# Patient Record
Sex: Female | Born: 2000
Health system: Southern US, Community
[De-identification: ages and names within clinical notes are randomized; demographics above are authoritative.]

## PROBLEM LIST (undated history)

## (undated) DIAGNOSIS — J45909 Unspecified asthma, uncomplicated: Secondary | ICD-10-CM

## (undated) DIAGNOSIS — R011 Cardiac murmur, unspecified: Secondary | ICD-10-CM

---
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
## 2018-06-03 ENCOUNTER — Encounter: Payer: Self-pay | Admitting: Neurology

## 2018-06-10 ENCOUNTER — Ambulatory Visit (INDEPENDENT_AMBULATORY_CARE_PROVIDER_SITE_OTHER): Admitting: Neurology

## 2018-06-10 ENCOUNTER — Encounter: Payer: Self-pay | Admitting: Neurology

## 2018-06-10 ENCOUNTER — Other Ambulatory Visit (INDEPENDENT_AMBULATORY_CARE_PROVIDER_SITE_OTHER)

## 2018-06-10 VITALS — BP 86/60 | HR 88 | Ht 65.0 in | Wt 119.0 lb

## 2018-06-10 DIAGNOSIS — F419 Anxiety disorder, unspecified: Secondary | ICD-10-CM | POA: Diagnosis not present

## 2018-06-10 DIAGNOSIS — R413 Other amnesia: Secondary | ICD-10-CM

## 2018-06-10 DIAGNOSIS — F329 Major depressive disorder, single episode, unspecified: Secondary | ICD-10-CM

## 2018-06-10 DIAGNOSIS — F0781 Postconcussional syndrome: Secondary | ICD-10-CM | POA: Diagnosis not present

## 2018-06-10 DIAGNOSIS — G43009 Migraine without aura, not intractable, without status migrainosus: Secondary | ICD-10-CM

## 2018-06-10 DIAGNOSIS — F32A Depression, unspecified: Secondary | ICD-10-CM

## 2018-06-10 LAB — VITAMIN B12: Vitamin B-12: 708 pg/mL (ref 211–911)

## 2018-06-10 MED ORDER — NORTRIPTYLINE HCL 10 MG PO CAPS: 10.0000 mg | ORAL_CAPSULE | Freq: Every day | ORAL | 3 refills | Status: AC

## 2018-06-10 NOTE — Patient Instructions (Signed)
1.  To help reduce headaches and help with sleep, will start nortriptyline 10mg  at bedtime.  If symptoms not improved in 4 weeks, contact me and we can increase dose.  Do not stop Wellbutrin unless you discuss with your PCP. 2.  For abortive therapy, try Excedrin.  If ineffective, contact me 3.  Limit use of pain relievers to no more than 2 days out of week to prevent risk of rebound or medication-overuse headache. 4.  Keep headache diary 5.  Exercise, hydration, caffeine cessation, sleep hygiene, monitor for and avoid triggers 6.  Consider:  magnesium citrate 400mg  daily, riboflavin 400mg  daily, and coenzyme Q10 100mg  three times daily 7.  To assess memory, I will refer you for neurocognitive testing.  We will also check B12 level 8.  Due to persistent concussion symptoms, will check MRI of brain without contrast. 9. Follow up in 4 months

## 2018-06-10 NOTE — Progress Notes (Signed)
NEUROLOGY CONSULTATION NOTE  Emma Huffman MRN: [id number] DOB: 05/06/2000  Referring provider: Wess Botts, PA Primary care provider: Wess Botts, PA  Reason for consult:  headaches  HISTORY OF PRESENT ILLNESS: Emma Huffman is a 18 year old woman with asthma and depression who presents for headaches.  Street supplemented by referring provider note.  She is accompanied by her mother who supplements history.  She sustained a concussion in December 2018 after a metal statue was accidentally pushed off a counter, and hit her in the right forehead.  There was associated loss of consciousness.  On 05/18/17, she was seen in the ED at [facility] where CT of the head that was performed and was negative.  She was diagnosed with a concussion at the time and underwent rehabilitation.  Symptoms gradually improved and resolved.  In December 2019, she began having recurrence of her concussion symptoms, although she did not have any recurrent concussion.  Headaches are described as severe pounding and pressure-like pain in the right forehead that spreads over her entire head.  She reports intermittent blurred vision, difficulty focusing, photophobia, phonophobia, nausea and lightheadedness.  She also reports problems with memory and feeling unbalanced on her feet.  There is no associated unilateral numbness or weakness.  There is no preceding aura.  Headaches typically last all day, and occur 3 to 4 times a week.  They are triggered by bright lights/headlights.  They are not relieved by anything.  She was also evaluated by gastroenterology for intractable nausea and vomiting.    Current NSAIDS:  none Current analgesics:  none Current triptans:  none Current ergotamine:  none Current anti-emetic:  Zofran 8mg  Current muscle relaxants:  none Current anti-anxiolytic:  none Current sleep aide:  none Current Antihypertensive medications:  none Current Antidepressant medications:  Wellbutrin 150mg  Current  Anticonvulsant medications:  none Current anti-CGRP:  none Current Vitamins/Herbal/Supplements:  none Current Antihistamines/Decongestants:  Singulair Other therapy:  none Hormone/birth control:  none  Past NSAIDS:  none Past analgesics:  Tylenol Past abortive triptans:  none Past abortive ergotamine:  none Past muscle relaxants:  none Past anti-emetic:  none Past antihypertensive medications:  none Past antidepressant medications:  none Past anticonvulsant medications:  none Past anti-CGRP:  none Past vitamins/Herbal/Supplements:  none Past antihistamines/decongestants:  none Other past therapies:  none  Caffeine:  No coffee.  Rarely soda. Diet:  Tries to drink water Exercise:  no Depression:  yes; Anxiety:  yes Other pain:  no Sleep hygiene:  poor Family history of headache:  No  PAST MEDICAL HISTORY: Depression and anxiety  PAST SURGICAL HISTORY: None  MEDICATIONS: Current Outpatient Medications on File Prior to Visit  Medication Sig Dispense Refill  . esomeprazole (NEXIUM) 20 MG capsule Take 20 mg by mouth daily.    Marland Kitchen linaclotide (LINZESS) 72 MCG capsule Take 72 mcg by mouth daily before breakfast.    . ondansetron (ZOFRAN) 4 MG tablet Take 8 mg by mouth 3 (three) times daily as needed.    . Albuterol Sulfate, sensor, (PROAIR DIGIHALER) 108 (90 Base) MCG/ACT AEPB Inhale into the lungs.    Marland Kitchen buPROPion (WELLBUTRIN XL) 150 MG 24 hr tablet Take 150 mg by mouth daily.    . montelukast (SINGULAIR) 10 MG tablet Take 10 mg by mouth daily.     No current facility-administered medications on file prior to visit.     ALLERGIES: No known allergies  FAMILY HISTORY: Grandparents:  Diabetes Sister(s):  Lung disease, asthma, seizures  SOCIAL HISTORY: Marital status:  Single  Tobacco use:  None Alcohol use:  None  REVIEW OF SYSTEMS: Constitutional: No fevers, chills, or sweats, no generalized fatigue, change in appetite Eyes: No visual changes, double vision, eye  pain Ear, nose and throat: No hearing loss, ear pain, nasal congestion, sore throat Cardiovascular: No chest pain, palpitations Respiratory:  No shortness of breath at rest or with exertion, wheezes GastrointestinaI: No nausea, vomiting, diarrhea, abdominal pain, fecal incontinence Genitourinary:  No dysuria, urinary retention or frequency Musculoskeletal:  No neck pain, back pain Integumentary: No rash, pruritus, skin lesions Neurological: as above Psychiatric: depression, insomnia, anxiety Endocrine: No palpitations, fatigue, diaphoresis, mood swings, change in appetite, change in weight, increased thirst Hematologic/Lymphatic:  No purpura, petechiae. Allergic/Immunologic: no itchy/runny eyes, nasal congestion, recent allergic reactions, rashes  PHYSICAL EXAM: Blood pressure (!) 86/60, pulse 88, height 5\' 5"  (1.651 m), weight 119 lb (54 kg), SpO2 99 %. General: No acute distress.  Patient appears well-groomed.  Head:  Normocephalic/atraumatic Eyes:  fundi examined but not visualized Neck: supple, no paraspinal tenderness, full range of motion Back: No paraspinal tenderness Heart: regular rate and rhythm Lungs: Clear to auscultation bilaterally. Vascular: No carotid bruits. Neurological Exam: Mental status: alert and oriented to person, place, and time, recent and remote memory intact, fund of knowledge intact, attention and concentration intact, speech fluent and not dysarthric, language intact. Cranial nerves: CN I: not tested CN II: pupils equal, round and reactive to light, visual fields intact CN III, IV, VI:  full range of motion, no nystagmus, no ptosis CN V: facial sensation intact CN VII: upper and lower face symmetric CN VIII: hearing intact CN IX, X: gag intact, uvula midline CN XI: sternocleidomastoid and trapezius muscles intact CN XII: tongue midline Bulk & Tone: normal, no fasciculations. Motor:  5/5 throughout  Sensation:  temperature and vibration sensation  intact. Deep Tendon Reflexes:  2+ throughout, toes downgoing.  Finger to nose testing:  Without dysmetria.  Heel to shin:  Without dysmetria.  Gait:  Normal station and stride.  Romberg negative.  IMPRESSION: Persistent postconcussive syndrome:  Migraine without aura, without status migrainosus, not intractable  Memory deficits  Insomnia Depression and anxiety  PLAN: 1.  Start nortriptyline 10mg  at bedtime.  We can increase to 25mg  at bedtime in 4 weeks if needed. 2.  For abortive therapy, Excedrin.  If ineffective, will try sumatriptan 3.  Limit use of pain relievers to no more than 2 days out of week to prevent risk of rebound or medication-overuse headache. 4.  Keep headache diary 5. Due to persistent concussive symptoms, getting worse, will check MRI of brain without contrast 6. To evaluate memory, check B12 and refer for neuropsychological testing. 7.  Exercise, hydration, caffeine cessation, sleep hygiene, monitor for and avoid triggers 8.  Consider:  magnesium citrate 400mg  daily, riboflavin 400mg  daily, and coenzyme Q10 100mg  three times daily 9.  Follow up in 4 months.   Thank you for allowing me to take part in the care of this patient.  Shon Millet, DO

## 2018-06-10 NOTE — Addendum Note (Signed)
Addended by: Dorthy Cooler on: 06/10/2018 01:26 PM   Modules accepted: Orders

## 2018-06-12 ENCOUNTER — Telehealth: Payer: Self-pay

## 2018-06-12 NOTE — Telephone Encounter (Signed)
-----   Message from Drema Dallas, DO sent at 06/10/2018  7:26 PM EST ----- B12 level is normal

## 2018-06-12 NOTE — Telephone Encounter (Signed)
Called and advised Pt of results. 

## 2018-06-16 ENCOUNTER — Ambulatory Visit
Admission: RE | Admit: 2018-06-16 | Discharge: 2018-06-16 | Disposition: A | Discharge status: Home / Self Care | Source: Ambulatory Visit | Attending: Neurology | Admitting: Neurology

## 2018-06-16 ENCOUNTER — Encounter: Payer: Self-pay | Admitting: Radiology

## 2018-06-16 DIAGNOSIS — R413 Other amnesia: Secondary | ICD-10-CM

## 2018-06-16 DIAGNOSIS — F0781 Postconcussional syndrome: Secondary | ICD-10-CM

## 2018-06-16 DIAGNOSIS — G43009 Migraine without aura, not intractable, without status migrainosus: Secondary | ICD-10-CM

## 2018-06-16 IMAGING — MR MR HEAD W/O CM
10 series · 48 of 48 positions shown · non-contrast
Comparison: None.

CLINICAL DATA: Migraine headaches. History of concussion. Memory
loss.

EXAM:
MRI HEAD WITHOUT CONTRAST
TECHNIQUE: Multiplanar, multiecho pulse sequences of the brain and surrounding
structures were obtained without intravenous contrast.

[Series 2: T1 · sagittal · 5.0mm · 0.45mm/px · 1 of 21 slices shown]
[im 1/21]
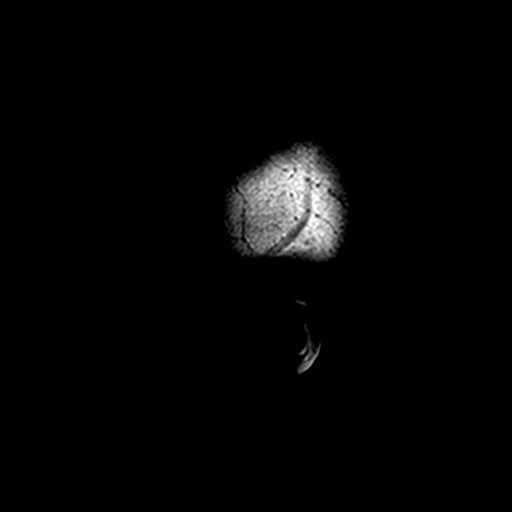

[Series 3: DWI · axial · 3.0mm · 1.80mm/px · z∈[-48,+89]mm · 9 of 100 slices shown (1 of 4)]
[im 1/100]
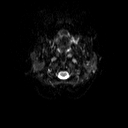
[im 13/100]
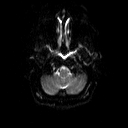
[im 25/100]
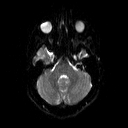
[im 38/100]
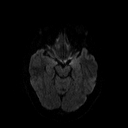
[im 50/100]
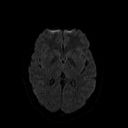
[im 62/100]
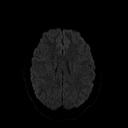
[im 75/100]
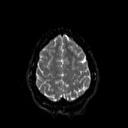
[im 87/100]
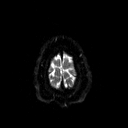
[im 100/100]
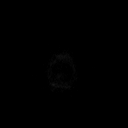

[Series 4: DWI · axial · 3.0mm · 1.80mm/px · z∈[-48,+89]mm · 4 of 49 slices shown (2 of 4)]
[im 1/49]
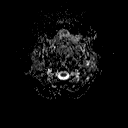
[im 17/49]
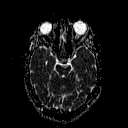
[im 33/49]
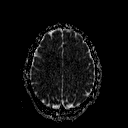
[im 49/49]
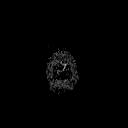

[Series 5: DWI · coronal · 5.0mm · 1.80mm/px · 6 of 68 slices shown (3 of 4)]
[im 1/68]
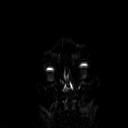
[im 14/68]
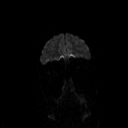
[im 27/68]
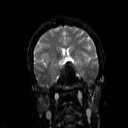
[im 41/68]
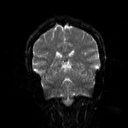
[im 54/68]
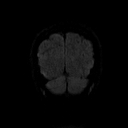
[im 68/68]
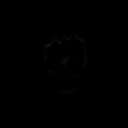

[Series 6: DWI · coronal · 5.0mm · 1.80mm/px · 3 of 34 slices shown (4 of 4)]
[im 1/34]
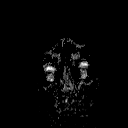
[im 17/34]
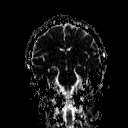
[im 34/34]
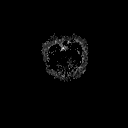

[Series 7: T2 · axial · 5.0mm · 0.51mm/px · z∈[-49,+88]mm · 2 of 22 slices shown (1 of 2)]
[im 1/22]
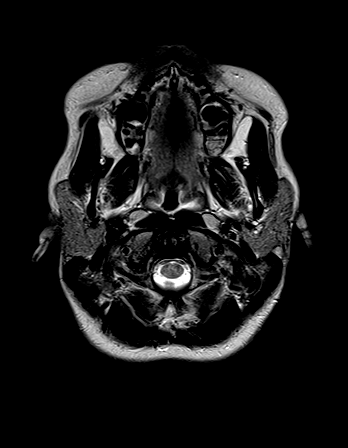
[im 22/22]
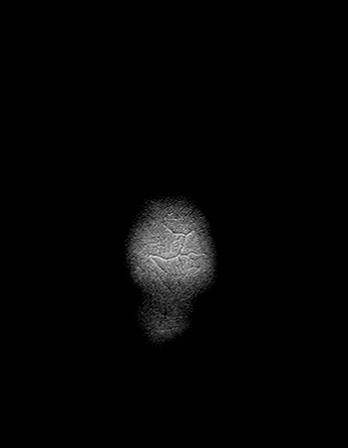

[Series 8: FLAIR · axial · 3.0mm · 0.45mm/px · z∈[-53,+89]mm · 3 of 34 slices shown]
[im 1/34]
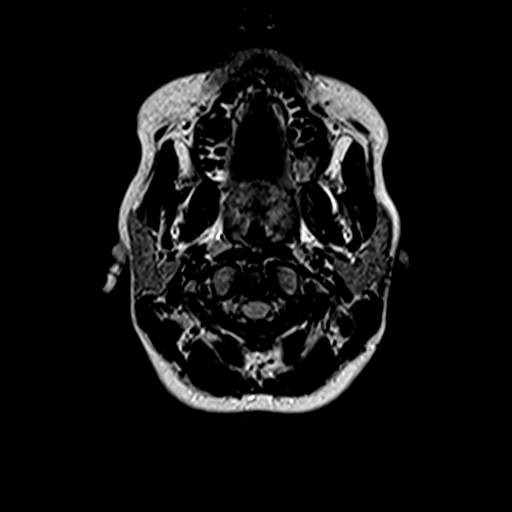
[im 17/34]
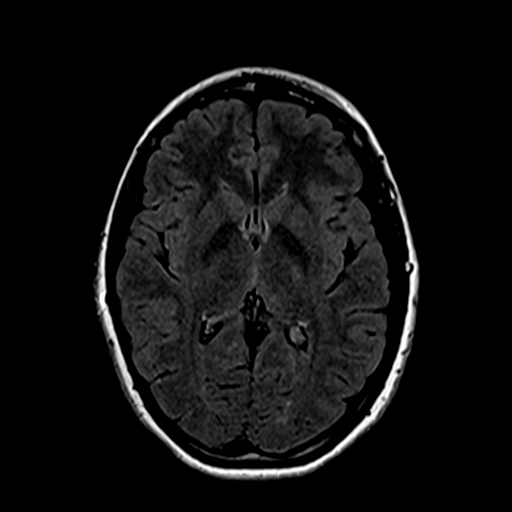
[im 34/34]
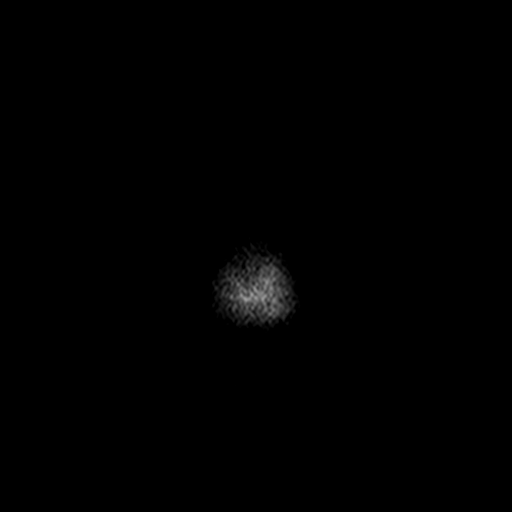

[Series 10: swi_images · axial · 4.0mm · 0.90mm/px · z∈[-54,+91]mm · 4 of 40 slices shown]
[im 1/40]
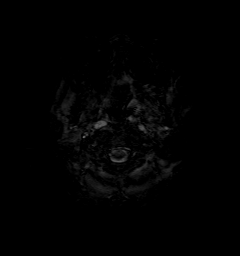
[im 14/40]
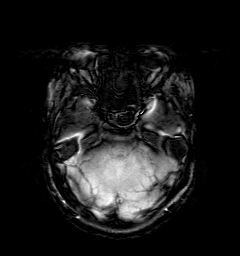
[im 27/40]
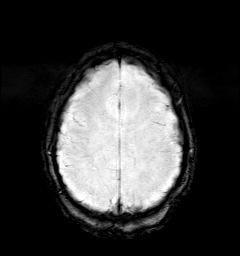
[im 40/40]
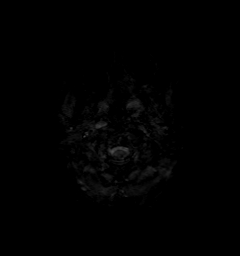

[Series 11: t1_mpr_tra · axial · 1.1mm · 0.71mm/px · z∈[-52,+92]mm · 13 of 144 slices shown]
[im 1/144]
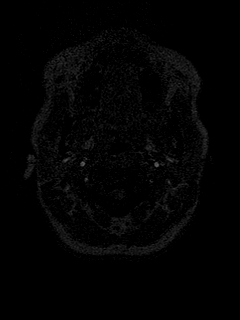
[im 12/144]
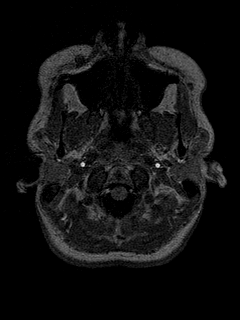
[im 24/144]
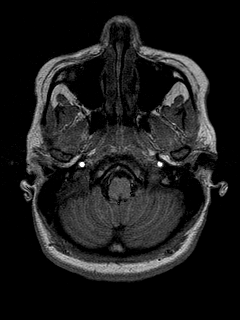
[im 36/144]
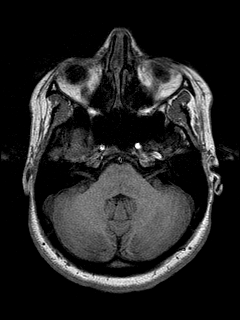
[im 48/144]
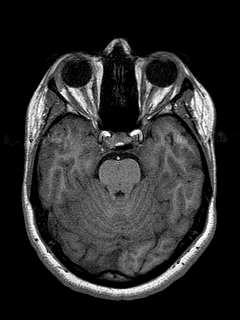
[im 60/144]
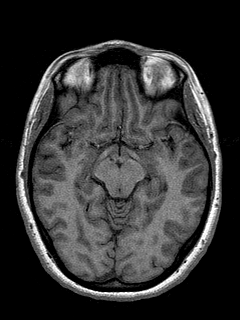
[im 72/144]
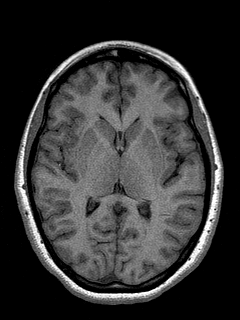
[im 84/144]
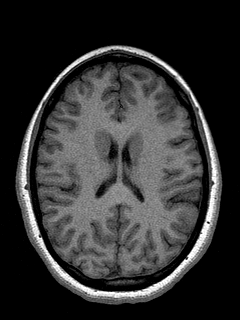
[im 96/144]
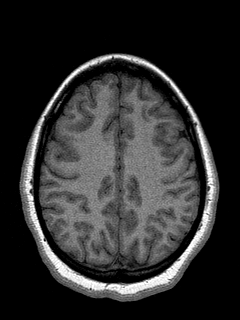
[im 108/144]
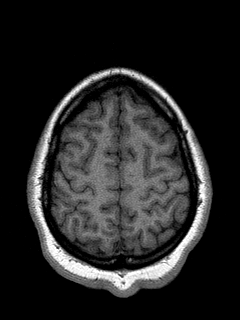
[im 120/144]
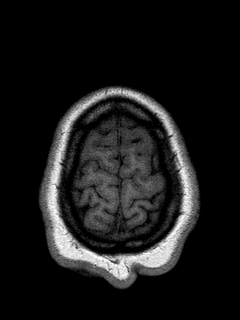
[im 132/144]
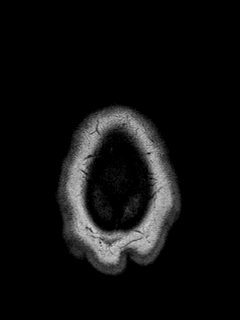
[im 144/144]
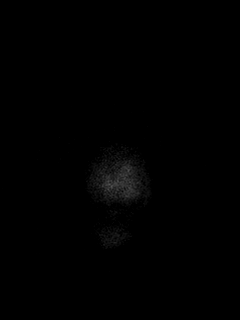

[Series 12: T2 · coronal · 5.0mm · 0.45mm/px · 3 of 29 slices shown (2 of 2)]
[im 1/29]
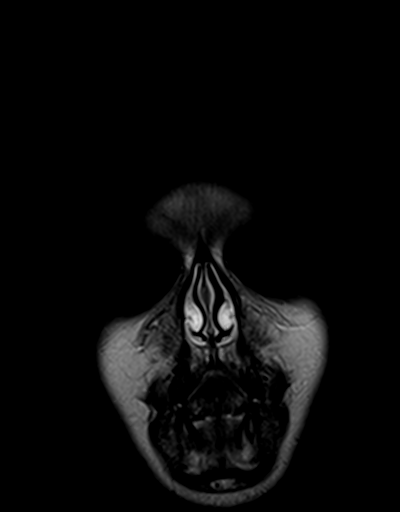
[im 15/29]
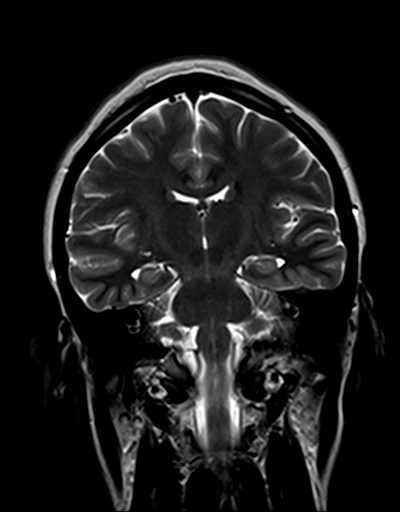
[im 29/29]
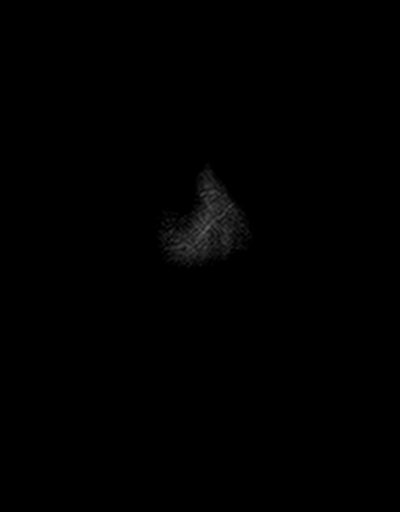

[48 of 48 positions shown; findings below may reference images not displayed]

FINDINGS: Brain: The brain has a normal appearance without evidence of
malformation, atrophy, old or acute small or large vessel
infarction, mass lesion, hemorrhage, hydrocephalus or extra-axial
collection.

Vascular: Major vessels at the base of the brain show flow. Venous
sinuses appear patent.

Skull and upper cervical spine: Normal.

Sinuses/Orbits: Clear/normal.

Other: None significant.
IMPRESSION: Normal examination. No post traumatic finding. No cause of the
presenting symptoms is identified.

## 2018-06-19 ENCOUNTER — Telehealth: Payer: Self-pay

## 2018-06-19 NOTE — Telephone Encounter (Signed)
Called and spoke with Pt, advised her MRI was normal

## 2018-06-19 NOTE — Telephone Encounter (Signed)
-----   Message from Drema Dallas, DO sent at 06/18/2018  7:15 AM EST ----- MRI of brain is normal

## 2018-07-09 ENCOUNTER — Telehealth: Payer: Self-pay

## 2018-07-09 NOTE — Telephone Encounter (Signed)
Received a message while the system was down from [facility], [place], asking for notes on Pt and referral. Pt was there.  Attempted to fax notes to the number they provided [place] that we have had difficulty with for months. We have many times ended up mailing the information after many failed attempts faxing. I called the number left on the message, it was a fax number. I faxed the information to [phone]

## 2018-07-23 ENCOUNTER — Telehealth: Payer: Self-pay | Admitting: Neurology

## 2018-07-23 NOTE — Telephone Encounter (Signed)
Called and spoke with Emma Huffman. Apparently the referral was received at a different location, at [facility]. I advised her I will mail the referral.

## 2018-07-23 NOTE — Telephone Encounter (Signed)
Mother is calling in about a referral. She was upset because she got a referral to just another neuro instead of a neuropsych. She is saying her daughter is getting worse and she hasn't heard anything about the new referral for neuropsych. She wants you to call her because she is confused about the next steps. Please call her back at [phone]. Thanks!

## 2018-10-09 ENCOUNTER — Encounter: Payer: Self-pay | Admitting: Neurology

## 2018-10-11 NOTE — Progress Notes (Signed)
No show

## 2018-10-12 ENCOUNTER — Telehealth: Admitting: Neurology

## 2018-10-12 ENCOUNTER — Other Ambulatory Visit: Payer: Self-pay

## 2018-11-20 ENCOUNTER — Encounter (HOSPITAL_COMMUNITY): Payer: Self-pay

## 2018-11-20 ENCOUNTER — Other Ambulatory Visit: Payer: Self-pay

## 2018-11-20 ENCOUNTER — Emergency Department (HOSPITAL_COMMUNITY): Payer: 59

## 2018-11-20 ENCOUNTER — Emergency Department (HOSPITAL_COMMUNITY)
Admission: EM | Admit: 2018-11-20 | Discharge: 2018-11-20 | Disposition: A | Discharge status: Home / Self Care | Payer: 59 | Attending: Emergency Medicine | Admitting: Emergency Medicine

## 2018-11-20 DIAGNOSIS — S61212A Laceration without foreign body of right middle finger without damage to nail, initial encounter: Secondary | ICD-10-CM | POA: Insufficient documentation

## 2018-11-20 DIAGNOSIS — W231XXA Caught, crushed, jammed, or pinched between stationary objects, initial encounter: Secondary | ICD-10-CM | POA: Insufficient documentation

## 2018-11-20 DIAGNOSIS — Z79899 Other long term (current) drug therapy: Secondary | ICD-10-CM | POA: Diagnosis not present

## 2018-11-20 DIAGNOSIS — S6991XA Unspecified injury of right wrist, hand and finger(s), initial encounter: Secondary | ICD-10-CM | POA: Diagnosis present

## 2018-11-20 DIAGNOSIS — J45909 Unspecified asthma, uncomplicated: Secondary | ICD-10-CM | POA: Diagnosis not present

## 2018-11-20 DIAGNOSIS — Y990 Civilian activity done for income or pay: Secondary | ICD-10-CM | POA: Diagnosis not present

## 2018-11-20 DIAGNOSIS — Y9389 Activity, other specified: Secondary | ICD-10-CM | POA: Diagnosis not present

## 2018-11-20 DIAGNOSIS — S61214A Laceration without foreign body of right ring finger without damage to nail, initial encounter: Secondary | ICD-10-CM | POA: Insufficient documentation

## 2018-11-20 DIAGNOSIS — Y92812 Truck as the place of occurrence of the external cause: Secondary | ICD-10-CM | POA: Insufficient documentation

## 2018-11-20 HISTORY — DX: Unspecified asthma, uncomplicated: J45.909

## 2018-11-20 IMAGING — DX RIGHT HAND - COMPLETE 3+ VIEW
3 series · 3 of 3 positions shown · non-contrast
Comparison: None.

CLINICAL DATA: Heavy roller dropped on hand with pain and swelling,
initial encounter

EXAM:
RIGHT HAND - COMPLETE 3+ VIEW

[hand pa]
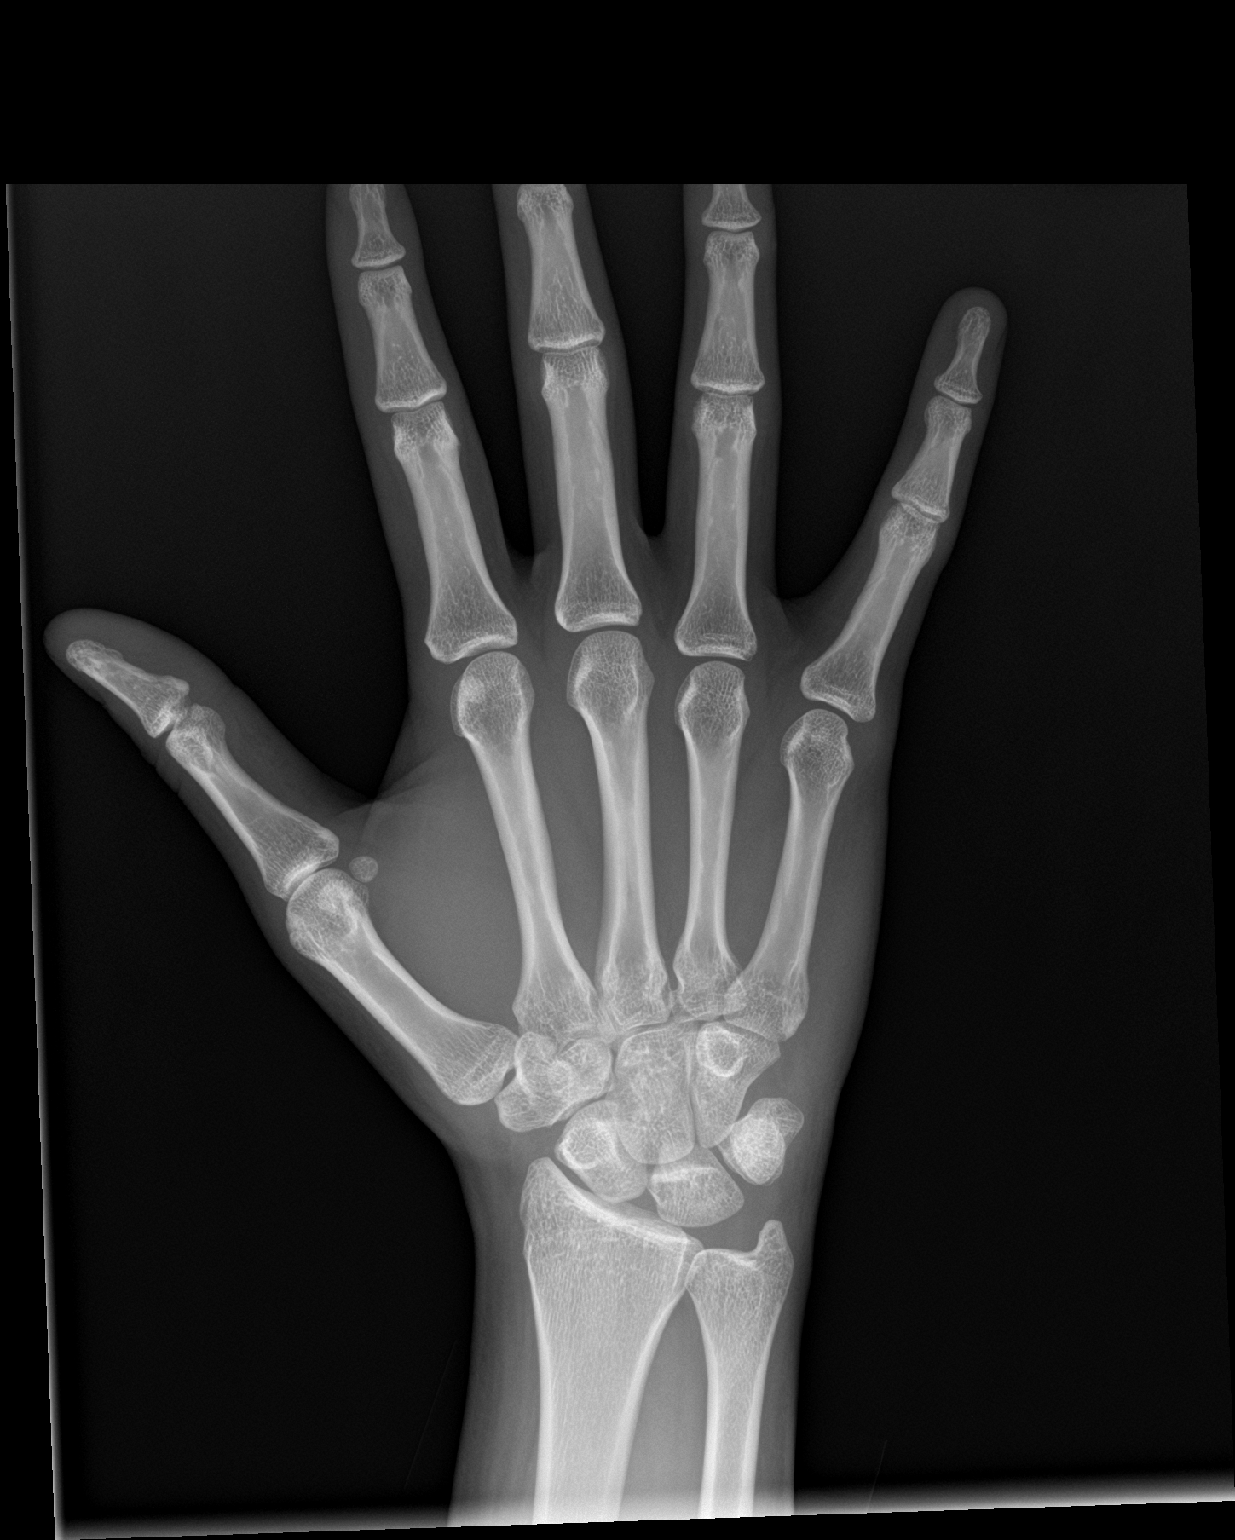

[hand obl]
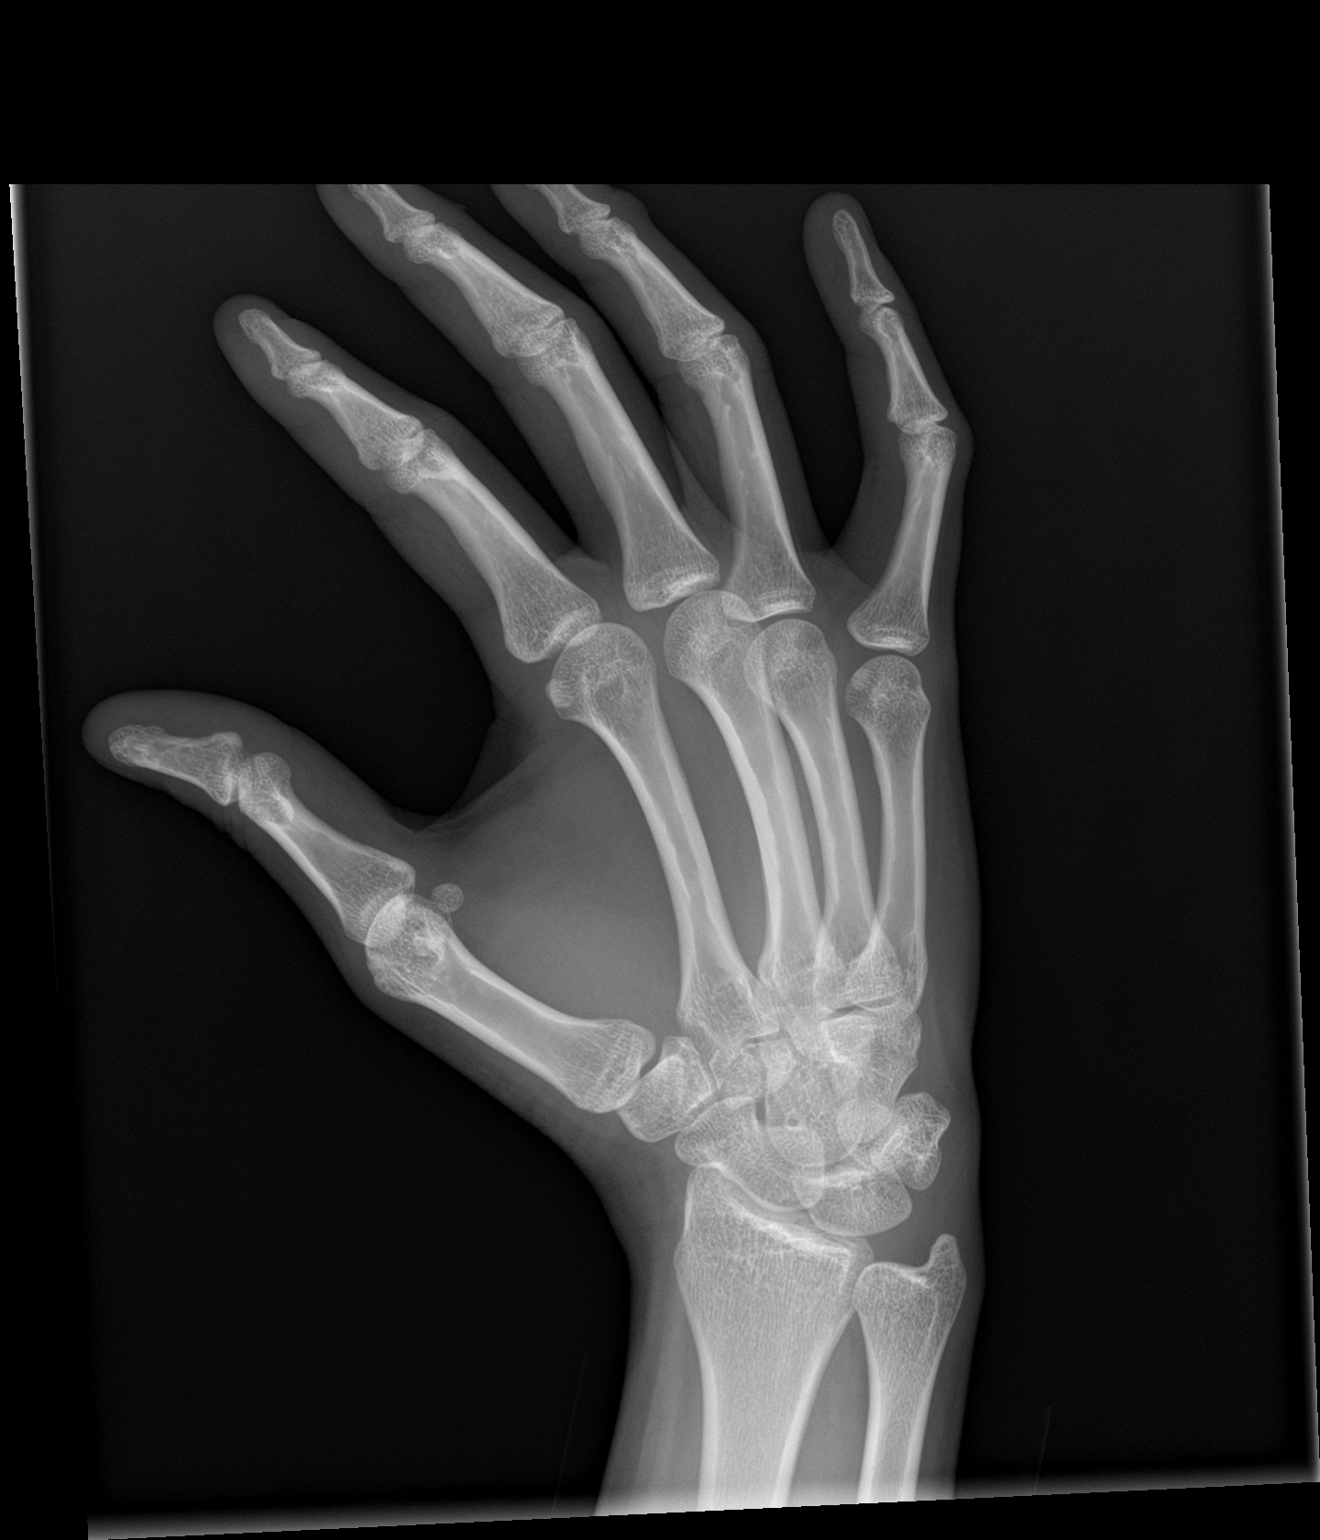

[hand lat]
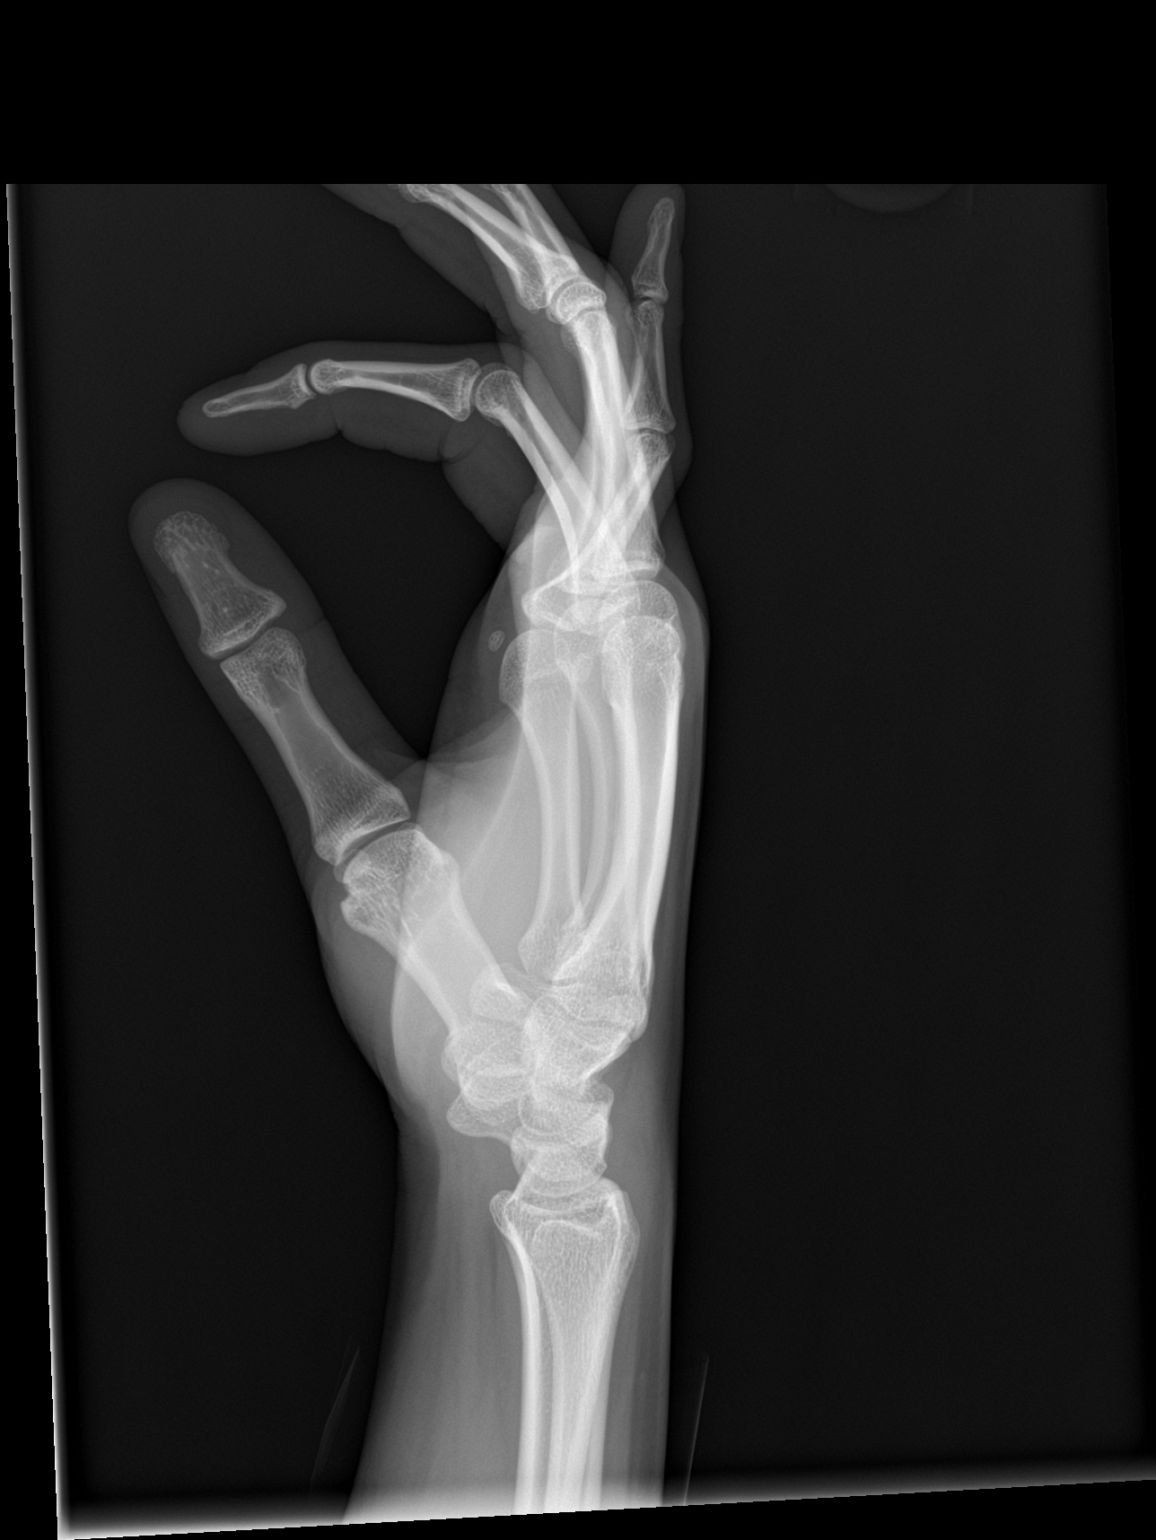

[3 of 3 positions shown; findings below may reference images not displayed]

FINDINGS: Mild soft tissue irregularity is noted about the DIP joints of the
third and fourth digits. No acute fracture or dislocation is noted.
IMPRESSION: No bony abnormality noted.  Mild soft tissue injury is seen.

## 2018-11-20 NOTE — ED Provider Notes (Signed)
[facility]   CSN: [id number] Arrival date & time: 11/20/18  1505    History   Chief Complaint Chief Complaint  Patient presents with  . Finger Injury    HPI Emma Huffman is a 18 y.o. female history of asthma, presenting to the emergency department with laceration to her right third and fourth digits that occurred at work this morning at 3 AM.  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  She states her right hand got smashed under a box.  Her fingers were not pinned for any period of time.  She has had some intermittent bleeding from the wounds to her fingers since then and some pain with movement of the DIP joints of those digits.  No history of immunocompromise.  Tetanus is up-to-date.     The history is provided by the patient.    Past Medical History:  Diagnosis Date  . Asthma     There are no active problems to display for this patient.   Past Surgical History:  Procedure Laterality Date  . KNEE ARTHROSCOPY Left 2018     OB History    Gravida  1   Para      Term      Preterm      AB      Living        SAB      TAB      Ectopic      Multiple      Live Births               Home Medications    Prior to Admission medications   Medication Sig Start Date End Date Taking? Authorizing Provider  Albuterol Sulfate, sensor, (PROAIR DIGIHALER) 108 (90 Base) MCG/ACT AEPB Inhale into the lungs.    [provider]  buPROPion (WELLBUTRIN XL) 150 MG 24 hr tablet Take 150 mg by mouth daily.    [provider]  EPINEPHrine 0.3 mg/0.3 mL IJ SOAJ injection  07/17/18   [provider]  esomeprazole (NEXIUM) 20 MG capsule Take 20 mg by mouth daily. 06/04/18   [provider]  linaclotide (LINZESS) 72 MCG capsule Take 72 mcg by mouth daily before breakfast.    [provider]  montelukast (SINGULAIR) 10 MG tablet Take 10 mg by mouth daily.    [provider]   nortriptyline (PAMELOR) 10 MG capsule Take 1 capsule (10 mg total) by mouth at bedtime. 06/10/18   Everlena CooperJaffe, Adam R, DO  ondansetron (ZOFRAN) 4 MG tablet Take 8 mg by mouth 3 (three) times daily as needed. 03/30/18   [provider]    Family History Family History  Problem Relation Age of Onset  . Depression Mother   . Anxiety disorder Mother   . Post-traumatic stress disorder Father   . Liver disease Father   . Multiple sclerosis Sister   . Seizures Sister   . Depression Sister   . Depression Maternal Grandmother        accidental overdose  . Post-traumatic stress disorder Maternal Grandfather   . Hypertension Maternal Grandfather   . Heart disease Maternal Grandfather     Social History Social History   Tobacco Use  . Smoking status: Never Smoker  . Smokeless tobacco: Never Used  Substance Use Topics  . Alcohol use: Never    Frequency: Never  . Drug use: Never     Allergies   Bee venom   Review  of Systems Review of Systems  Musculoskeletal: Positive for arthralgias.  Skin: Positive for wound.  Allergic/Immunologic: Negative for immunocompromised state.     Physical Exam Updated Vital Signs BP 113/71 (BP Location: Left Arm)   Pulse 77   Temp 99.1 F (37.3 C) (Oral)   LMP 11/20/2018 (Exact Date)   SpO2 100%   Physical Exam Vitals signs and nursing note reviewed.  Constitutional:      General: She is not in acute distress.    Appearance: She is well-developed.  HENT:     Head: Normocephalic and atraumatic.  Eyes:     Conjunctiva/sclera: Conjunctivae normal.  Cardiovascular:     Rate and Rhythm: Normal rate.  Pulmonary:     Effort: Pulmonary effort is normal.  Musculoskeletal:     Comments: There are partial avulsion lacerations over the DIP joints on the dorsum of the right third and fourth digits.  Wound seems superficial and are not actively bleeding.  Not grossly contaminated.  No ungual involvement.  Patient has normal and active range of  motion of the joints of the digits without difficulty.  There is no swelling or deformity.  Normal sensation.  Neurological:     Mental Status: She is alert.  Psychiatric:        Mood and Affect: Mood normal.        Behavior: Behavior normal.      ED Treatments / Results  Labs (all labs ordered are listed, but only abnormal results are displayed) Labs Reviewed - No data to display  EKG None  Radiology Dg Hand Complete Right  Result Date: 11/20/2018 CLINICAL DATA:  Heavy roller dropped on hand with pain and swelling, initial encounter EXAM: RIGHT HAND - COMPLETE 3+ VIEW COMPARISON:  None. FINDINGS: Mild soft tissue irregularity is noted about the DIP joints of the third and fourth digits. No acute fracture or dislocation is noted. IMPRESSION: No bony abnormality noted.  Mild soft tissue injury is seen. Electronically Signed   By: Inez Catalina M.D.   On: 11/20/2018 16:56    Procedures Procedures (including critical care time)  Medications Ordered in ED Medications - No data to display   Initial Impression / Assessment and Plan / ED Course  I have reviewed the triage vital signs and the nursing notes.  Pertinent labs & imaging results that were available during my care of the patient were reviewed by me and considered in my medical decision making (see chart for details).        Patient with superficial lacerations to the right third and fourth digits that occurred at 0300 this morning at work.  Tetanus is up-to-date.  No history of immunocompromise.  X-ray is negative for acute fracture.  Wounds cleaned and dressed, topical bacitracin applied.  Discussed wound care and return precautions.  Patient is safe for discharge.  Discussed results, findings, treatment and follow up. Patient advised of return precautions. Patient verbalized understanding and agreed with plan.   Final Clinical Impressions(s) / ED Diagnoses   Final diagnoses:  Laceration of right ring finger without  foreign body without damage to nail, initial encounter  Laceration of right middle finger without foreign body without damage to nail, initial encounter    ED Discharge Orders    None       Kasi Lasky, [place] N, PA-C 11/20/18 1726    Hayden Rasmussen, MD 11/21/18 [phone]

## 2018-11-20 NOTE — ED Triage Notes (Signed)
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 Happened at 0300 this morning. Pt reports it will not stop bleeding.

## 2018-11-20 NOTE — ED Notes (Signed)
Patient transported to X-ray 

## 2018-11-20 NOTE — Discharge Instructions (Signed)
Please read instructions below.  Keep your wound clean and covered. Gently clean it with soap and water, pat it dry, and reapply a clean bandage. You can take ibuprofen/advil as needed for pain Follow up with your primary care or urgent care for wound recheck as needed. Return to the ER for fever, pus draining from wound, redness, or new or worsening symptoms.

## 2018-12-18 ENCOUNTER — Emergency Department (HOSPITAL_COMMUNITY)
Admission: EM | Admit: 2018-12-18 | Discharge: 2018-12-18 | Disposition: A | Discharge status: Home / Self Care | Payer: 59 | Attending: Emergency Medicine | Admitting: Emergency Medicine

## 2018-12-18 ENCOUNTER — Other Ambulatory Visit: Payer: Self-pay

## 2018-12-18 ENCOUNTER — Encounter (HOSPITAL_COMMUNITY): Payer: Self-pay | Admitting: Emergency Medicine

## 2018-12-18 DIAGNOSIS — R0981 Nasal congestion: Secondary | ICD-10-CM | POA: Diagnosis present

## 2018-12-18 DIAGNOSIS — Z79899 Other long term (current) drug therapy: Secondary | ICD-10-CM | POA: Insufficient documentation

## 2018-12-18 DIAGNOSIS — J45909 Unspecified asthma, uncomplicated: Secondary | ICD-10-CM | POA: Diagnosis not present

## 2018-12-18 DIAGNOSIS — Z20828 Contact with and (suspected) exposure to other viral communicable diseases: Secondary | ICD-10-CM | POA: Insufficient documentation

## 2018-12-18 DIAGNOSIS — Z20822 Contact with and (suspected) exposure to covid-19: Secondary | ICD-10-CM

## 2018-12-18 LAB — SARS CORONAVIRUS 2 (TAT 6-24 HRS): SARS Coronavirus 2: NEGATIVE

## 2018-12-18 NOTE — Discharge Instructions (Addendum)
You have been seen today for runny nose. Please read and follow all provided instructions. Return to the emergency room for worsening condition or new concerning symptoms.    You were tested for covid today. Your results should be available in 2-5 days. Please self isolate and quarantine until you have the results. If positive you will need to continue to self quarantine for 2 weeks or until you are fever free for 3 days without medications.  1. Medications:  Continue usual home medications -You can take Tylenol as needed for fever or pain. Take as directed. You can also take over the counter medications for your symptoms such as nasal spray and decongestants if needed 2. Treatment: rest, drink plenty of fluids 3. Follow Up: Please follow up with your primary doctor in 2-5 days for discussion of your diagnoses and further evaluation after today's visit; Call today to arrange your follow up.  If you do not have a primary care doctor use the resource guide provided to find one;   ?

## 2018-12-18 NOTE — ED Provider Notes (Signed)
[facility] Note   CSN: [id number] Arrival date & time: 12/18/18  1012     History   Chief Complaint Chief Complaint  Patient presents with  . Nasal Congestion    HPI Emma KinsmanJada Comas is a 18 y.o. female with past medical history of asthma presents emergency department today with chief complaint of nasal congestion x2 days.  She states it feels like she has a runny nose, she reports clear mucus.  Patient states she works at [employer] and her job is requesting her to be COVID tested.  Patient states her significant other has had COVID-like symptoms including fever, cough, shortness of breath, loss of taste.  Patient did not take any medications for her symptoms prior to arrival.  She denies any pain.  No radiating or modifying factors.  She denies any other symptoms including fever, chills, chest pain, shortness of breath, sore throat, sinus pain, abdominal pain, nausea, vomiting, diarrhea. History provided by patient with additional history obtained from chart review.     Past Medical History:  Diagnosis Date  . Asthma     There are no active problems to display for this patient.   Past Surgical History:  Procedure Laterality Date  . KNEE ARTHROSCOPY Left 2018     OB History    Gravida  1   Para      Term      Preterm      AB      Living        SAB      TAB      Ectopic      Multiple      Live Births               Home Medications    Prior to Admission medications   Medication Sig Start Date End Date Taking? Authorizing Provider  Albuterol Sulfate, sensor, (PROAIR DIGIHALER) 108 (90 Base) MCG/ACT AEPB Inhale into the lungs.    [provider]  buPROPion (WELLBUTRIN XL) 150 MG 24 hr tablet Take 150 mg by mouth daily.    [provider]  EPINEPHrine 0.3 mg/0.3 mL IJ SOAJ injection  07/17/18   [provider]  esomeprazole (NEXIUM) 20 MG capsule Take 20 mg by mouth daily. 06/04/18    [provider]  linaclotide (LINZESS) 72 MCG capsule Take 72 mcg by mouth daily before breakfast.    [provider]  montelukast (SINGULAIR) 10 MG tablet Take 10 mg by mouth daily.    [provider]  nortriptyline (PAMELOR) 10 MG capsule Take 1 capsule (10 mg total) by mouth at bedtime. 06/10/18   Everlena CooperJaffe, Adam R, DO  ondansetron (ZOFRAN) 4 MG tablet Take 8 mg by mouth 3 (three) times daily as needed. 03/30/18   [provider]    Family History Family History  Problem Relation Age of Onset  . Depression Mother   . Anxiety disorder Mother   . Post-traumatic stress disorder Father   . Liver disease Father   . Multiple sclerosis Sister   . Seizures Sister   . Depression Sister   . Depression Maternal Grandmother        accidental overdose  . Post-traumatic stress disorder Maternal Grandfather   . Hypertension Maternal Grandfather   . Heart disease Maternal Grandfather     Social History Social History   Tobacco Use  . Smoking status: Never Smoker  . Smokeless tobacco: Never Used  Substance Use Topics  .  Alcohol use: Never    Frequency: Never  . Drug use: Never     Allergies   Bee venom, Dust mite extract, and Pollen extract   Review of Systems Review of Systems  Constitutional: Negative for chills, fatigue and fever.  HENT: Positive for congestion. Negative for ear discharge, ear pain, facial swelling, sinus pain, sore throat, trouble swallowing and voice change.   Eyes: Negative for pain and redness.  Respiratory: Negative for cough and shortness of breath.   Cardiovascular: Negative for chest pain.  Gastrointestinal: Negative for abdominal pain, nausea and vomiting.  Musculoskeletal: Negative for arthralgias and neck pain.  Skin: Negative for wound.     Physical Exam Updated Vital Signs BP 119/73 (BP Location: Right Arm)   Pulse 88   Temp 98.9 F (37.2 C) (Oral)   Resp 16   Ht 5\' 5"  (1.651 m)   LMP 12/17/2018 (Exact  Date)   SpO2 100%   Breastfeeding No   BMI 21.97 kg/m   Physical Exam Vitals signs and nursing note reviewed.  Constitutional:      Appearance: She is well-developed. She is not ill-appearing or toxic-appearing.  HENT:     Head: Normocephalic and atraumatic.     Comments: No sinus or temporal tenderness.    Nose: Nose normal.     Mouth/Throat:     Mouth: Mucous membranes are moist.     Pharynx: Oropharynx is clear.     Comments: No erythema to oropharynx, no edema, no exudate, no tonsillar swelling, voice normal, neck supple without lymphadenopathy  Eyes:     General: No scleral icterus.       Right eye: No discharge.        Left eye: No discharge.     Conjunctiva/sclera: Conjunctivae normal.  Neck:     Musculoskeletal: Normal range of motion. No neck rigidity or muscular tenderness.     Vascular: No JVD.  Cardiovascular:     Rate and Rhythm: Normal rate and regular rhythm.     Pulses: Normal pulses.     Heart sounds: Normal heart sounds.  Pulmonary:     Effort: Pulmonary effort is normal.     Breath sounds: Normal breath sounds.  Abdominal:     General: There is no distension.  Musculoskeletal: Normal range of motion.  Lymphadenopathy:     Cervical: No cervical adenopathy.  Skin:    General: Skin is warm and dry.     Findings: No rash.  Neurological:     Mental Status: She is oriented to person, place, and time.     GCS: GCS eye subscore is 4. GCS verbal subscore is 5. GCS motor subscore is 6.     Comments: Fluent speech, no facial droop.  Psychiatric:        Behavior: Behavior normal.      ED Treatments / Results  Labs (all labs ordered are listed, but only abnormal results are displayed) Labs Reviewed  NOVEL CORONAVIRUS, NAA (HOSPITAL ORDER, SEND-OUT TO REF LAB)    EKG None  Radiology No results found.  Procedures Procedures (including critical care time)  Medications Ordered in ED Medications - No data to display   Initial Impression /  Assessment and Plan / ED Course  I have reviewed the triage vital signs and the nursing notes.  Pertinent labs & imaging results that were available during my care of the patient were reviewed by me and considered in my medical decision making (see chart for details).  I  have reviewed patient's EMR to obtain pertinent PMH to assist in MDM.  Symptoms and exam most suggestive of uncomplicated viral illness. DDX incluldes viral URI/LRI, COVID-19.  No travel. No known exposures to confirmed COVID-19.    Exam is benign.  Normal WOB. No fever, tachypnea, tachycardia, hypoxemia. Lungs are CTAB. I do not think that a CXR is indicated at this time as VS are WNL, there are no signs of consolidation on auscultation and there is no hypoxia, increased WOB or other concerning features to exam. No significant h/o immunocompromise. Doubt bacterial bronchitis or pneumonia.  No signs or symptoms to suggest strep pharyngitis.  No clinical signs of severe illness, dehydration, to warrant further emergent work up in ER.  Given reassuring physical exam, symptoms, will discharge with symptomatic treatment. Send out covid test performed. Self-isolation instructions discussed. Pt was given home self-isolation instructions and instructions for family members. Pt understands signs and symptoms that would warrant return to ED. Pt comfortable and agreeable with POC.   Emma Huffman was evaluated in Emergency Department on 12/18/2018 for the symptoms described in the history of present illness. She was evaluated in the context of the global COVID-19 pandemic, which necessitated consideration that the patient might be at risk for infection with the SARS-CoV-2 virus that causes COVID-19. Institutional protocols and algorithms that pertain to the evaluation of patients at risk for COVID-19 are in a state of rapid change based on information released by regulatory bodies including the CDC and federal and state organizations. These policies  and algorithms were followed during the patient's care in the ED.   This note was prepared using Dragon voice recognition software and may include unintentional dictation errors due to the inherent limitations of voice recognition software.    Final Clinical Impressions(s) / ED Diagnoses   Final diagnoses:  Close Exposure to Covid-19 Virus    ED Discharge Orders    None       Flint Melter 12/18/18 1115    Lajean Saver, MD 12/19/18 [phone]

## 2018-12-18 NOTE — ED Triage Notes (Signed)
Pt works at [employer] and work is requiring [place] testing before returning to work because pt's girlfriend has COVID symptoms and is also getting tested today. Pt endorses her only symptom is a runny nose.

## 2019-12-27 ENCOUNTER — Encounter (HOSPITAL_COMMUNITY): Payer: Self-pay | Admitting: Emergency Medicine

## 2019-12-27 ENCOUNTER — Other Ambulatory Visit: Payer: Self-pay

## 2019-12-27 ENCOUNTER — Emergency Department (HOSPITAL_COMMUNITY)
Admission: EM | Admit: 2019-12-27 | Discharge: 2019-12-27 | Disposition: A | Discharge status: Home / Self Care | Payer: 59 | Attending: Emergency Medicine | Admitting: Emergency Medicine

## 2019-12-27 ENCOUNTER — Emergency Department (HOSPITAL_COMMUNITY): Payer: 59

## 2019-12-27 DIAGNOSIS — Z79899 Other long term (current) drug therapy: Secondary | ICD-10-CM | POA: Insufficient documentation

## 2019-12-27 DIAGNOSIS — R079 Chest pain, unspecified: Secondary | ICD-10-CM | POA: Diagnosis present

## 2019-12-27 DIAGNOSIS — J45909 Unspecified asthma, uncomplicated: Secondary | ICD-10-CM | POA: Insufficient documentation

## 2019-12-27 HISTORY — DX: Cardiac murmur, unspecified: R01.1

## 2019-12-27 LAB — BASIC METABOLIC PANEL
Anion gap: 10 (ref 5–15)
BUN: 7 mg/dL (ref 6–20)
CO2: 23 mmol/L (ref 22–32)
Calcium: 9.3 mg/dL (ref 8.9–10.3)
Chloride: 104 mmol/L (ref 98–111)
Creatinine, Ser: 0.85 mg/dL (ref 0.44–1.00)
GFR calc Af Amer: >60 mL/min (ref >60)
GFR calc non Af Amer: >60 mL/min (ref >60)
Glucose, Bld: 83 mg/dL (ref 70–99)
Potassium: 3.8 mmol/L (ref 3.5–5.1)
Sodium: 137 mmol/L (ref 135–145)

## 2019-12-27 LAB — CBC
HCT: 36.3 % (ref 36.0–46.0)
Hemoglobin: 11.5 g/dL — ABNORMAL LOW (ref 12.0–15.0)
MCH: 26.6 pg (ref 26.0–34.0)
MCHC: 31.7 g/dL (ref 30.0–36.0)
MCV: 83.8 fL (ref 80.0–100.0)
Platelets: 238 10*3/uL (ref 150–400)
RBC: 4.33 MIL/uL (ref 3.87–5.11)
RDW: 13 % (ref 11.5–15.5)
WBC: 4.6 10*3/uL (ref 4.0–10.5)
nRBC: 0 % (ref 0.0–0.2)

## 2019-12-27 LAB — TROPONIN I (HIGH SENSITIVITY)
Troponin I (High Sensitivity): <2 ng/L (ref <18)
Troponin I (High Sensitivity): <2 ng/L (ref <18)

## 2019-12-27 LAB — I-STAT BETA HCG BLOOD, ED (MC, WL, AP ONLY): I-stat hCG, quantitative: <5 m[IU]/mL (ref <5)

## 2019-12-27 IMAGING — DX DG CHEST 2V
2 series · 2 of 2 positions shown · non-contrast
Comparison: None.

CLINICAL DATA: Chest pain

EXAM:
CHEST - 2 VIEW

[w chest pa]
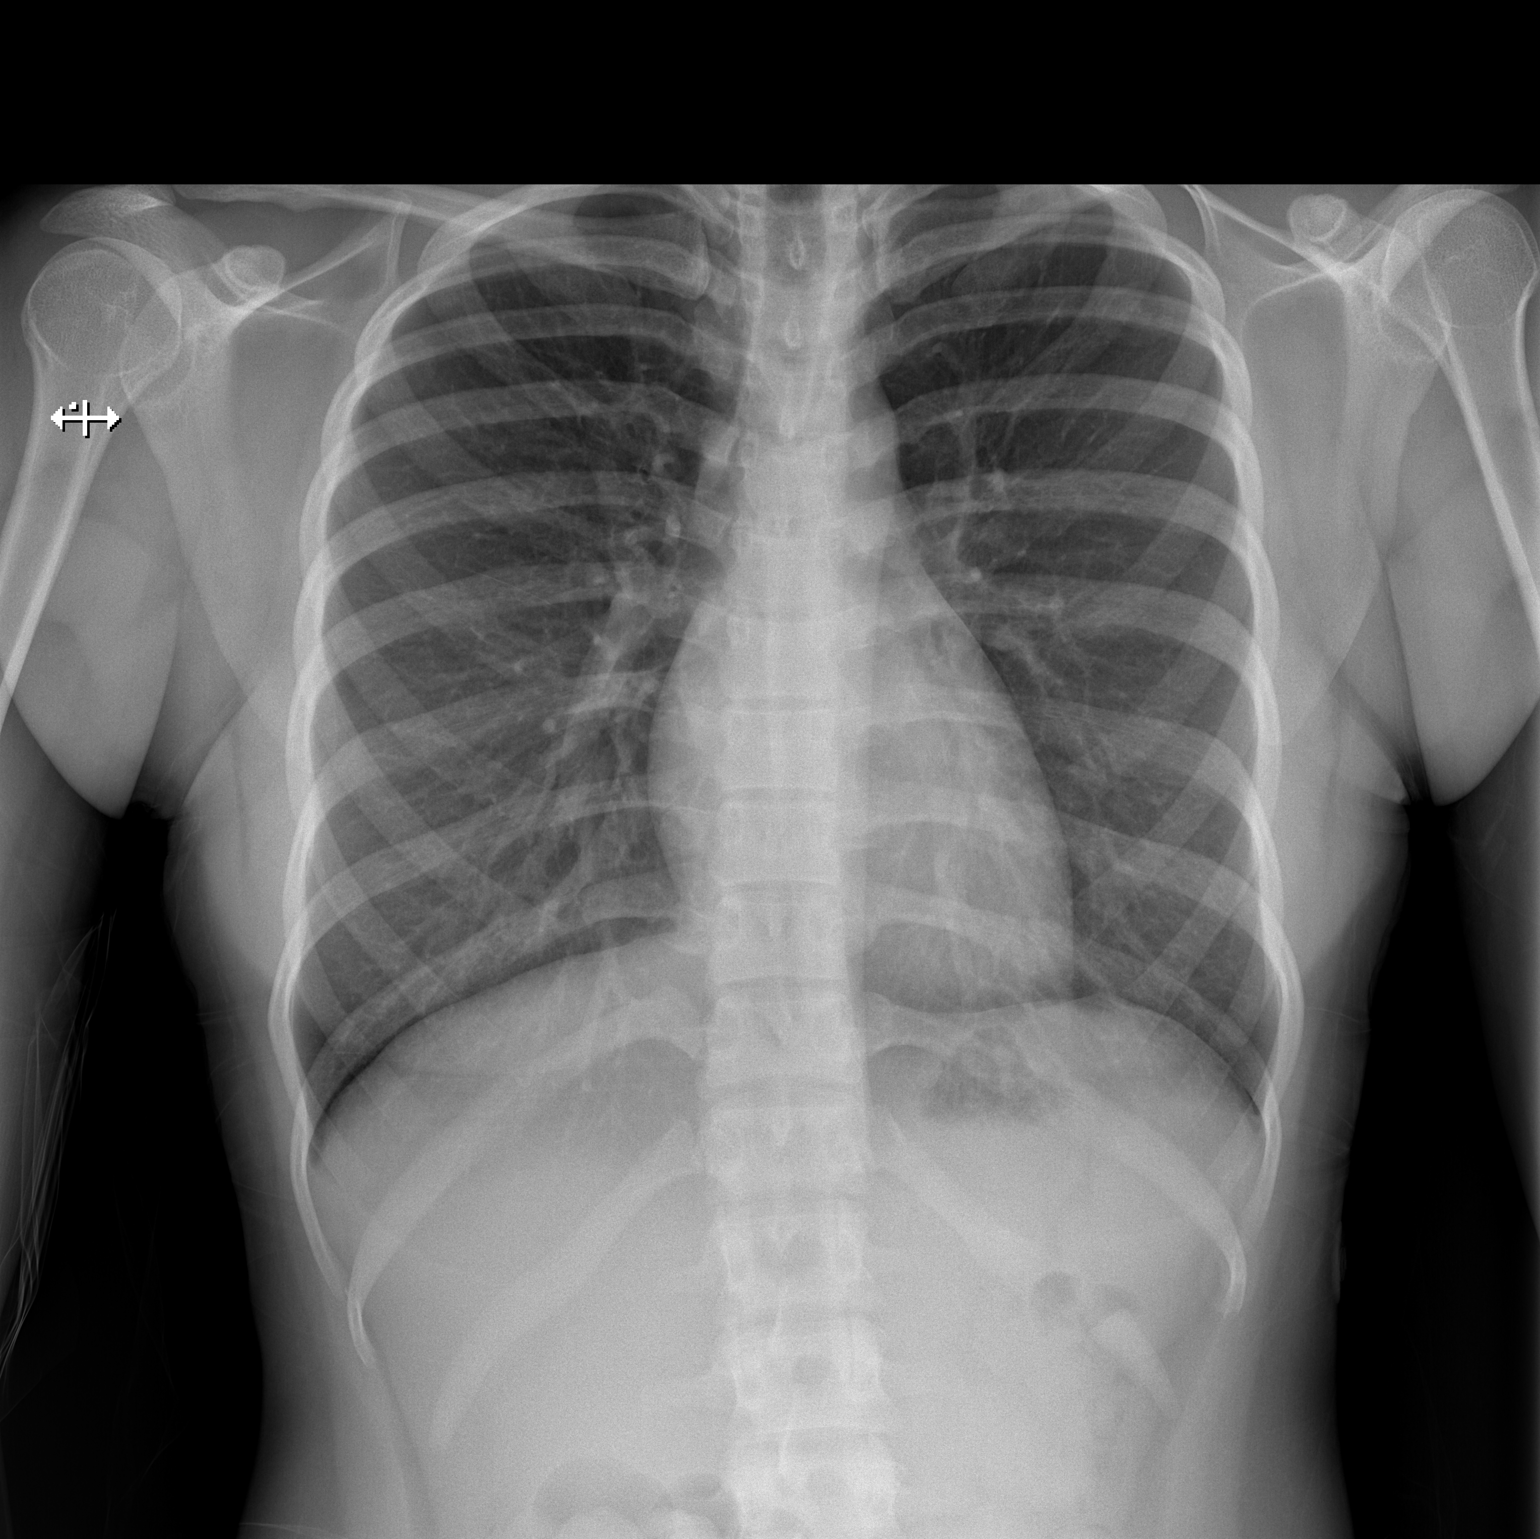

[w chest lat]
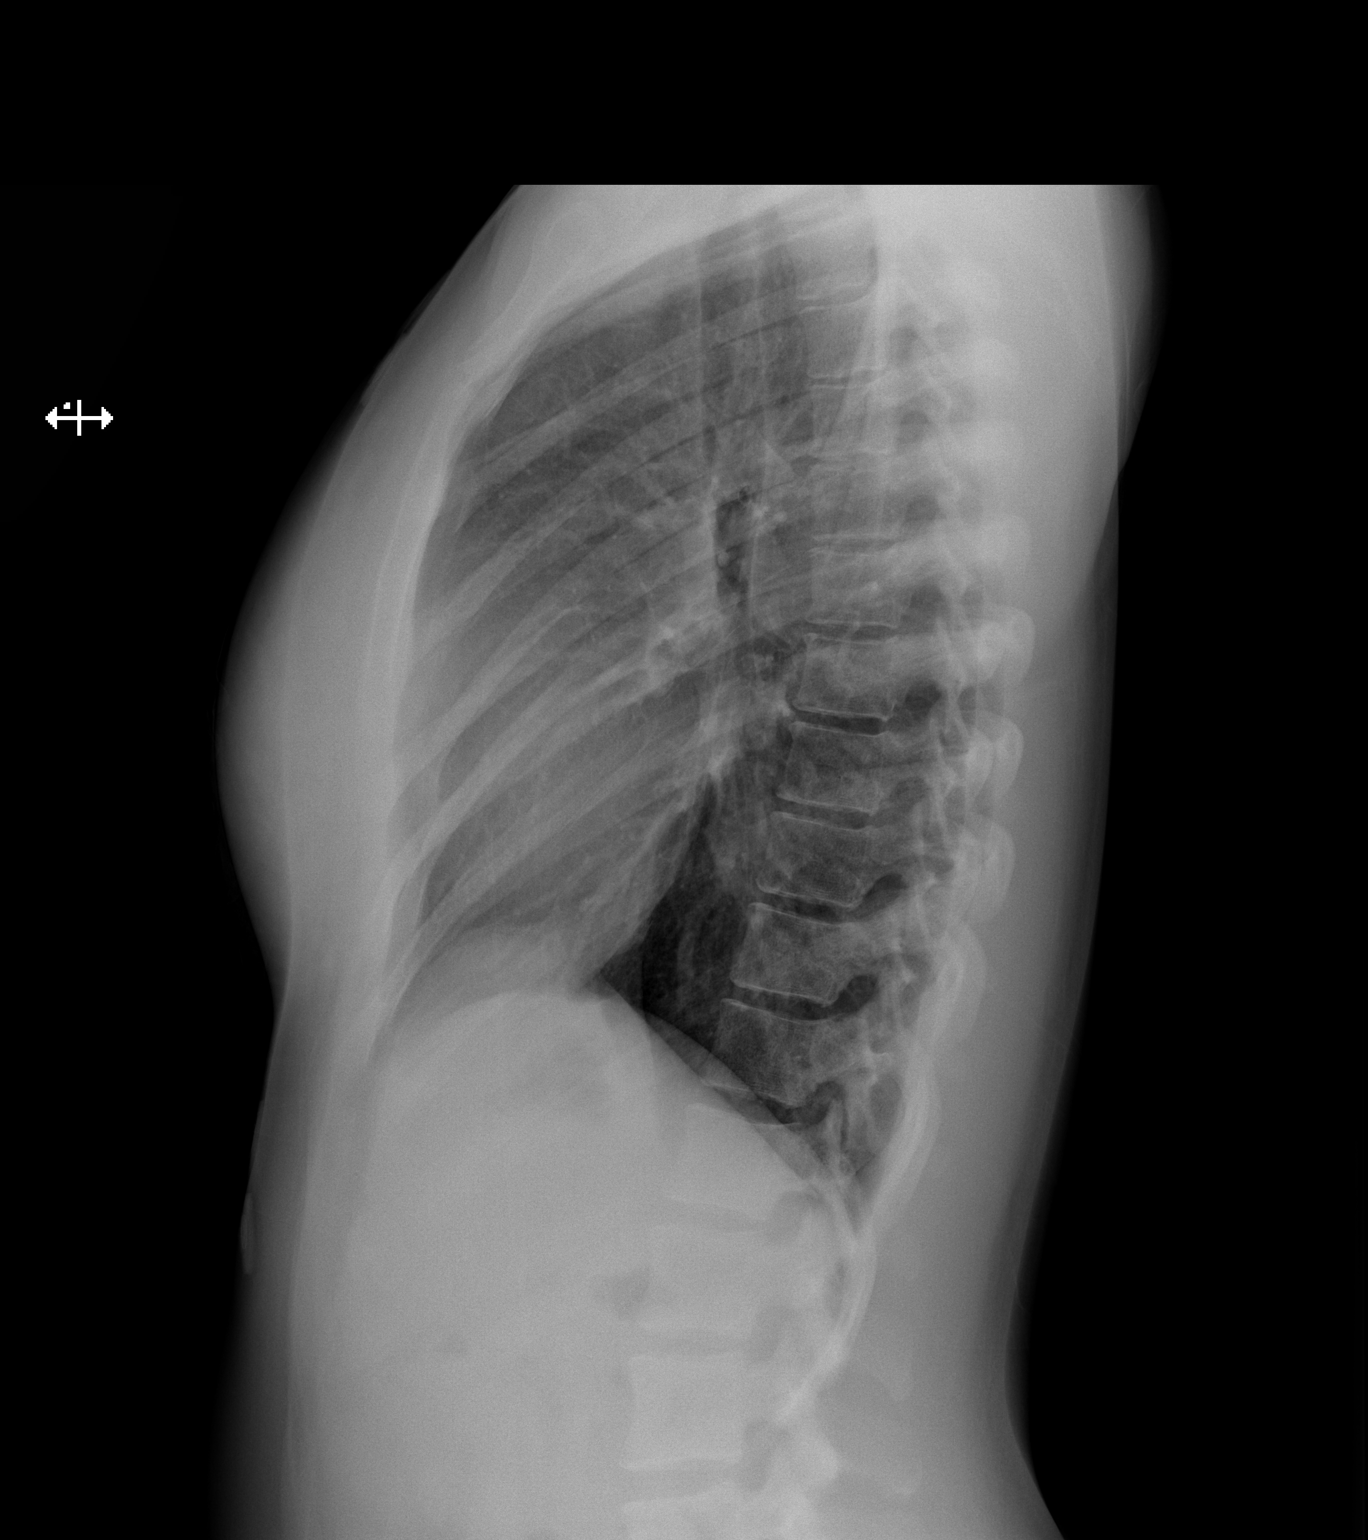

[2 of 2 positions shown; findings below may reference images not displayed]

FINDINGS: The heart size and mediastinal contours are within normal limits.
Both lungs are clear. No pleural effusions. No pneumothorax. The
visualized skeletal structures are unremarkable.
IMPRESSION: No acute cardiopulmonary disease.

## 2019-12-27 MED ORDER — NAPROXEN 250 MG PO TABS
500.0000 mg | ORAL_TABLET | Freq: Once | ORAL | Status: AC
  Administered 2019-12-27: 500 mg via ORAL
  Filled 2019-12-27: qty 2

## 2019-12-27 MED ORDER — NAPROXEN 500 MG PO TABS: 500.0000 mg | ORAL_TABLET | Freq: Two times a day (BID) | ORAL | 0 refills | Status: AC | PRN

## 2019-12-27 NOTE — ED Provider Notes (Signed)
[facility] EMERGENCY DEPARTMENT Provider Note   CSN: [id number] Arrival date & time: 12/27/19  1248     History Chief Complaint  Patient presents with   Chest Pain    Emma Huffman is a 19 y.o. female with a history of asthma who presents to the ED with complaints of chest pain that began around 11AM today.  Patient was laying down at rest with onset of pain to her central anterior chest. Pain is a stabbing/sharp sensation, worse when laying on her side, no alleviating factors. No intervention PTA. She felt a bit short of breath earlier but this has resolved. Pain overall improving.  She has a history of similar episodes of pain which have occurred intermittently over the past few years but have never lasted this long.  She denies fever, chills, cough, wheezing, recent injury, recent heavy lifting,  leg pain/swelling, hemoptysis, recent surgery/trauma, recent long travel, hormone use, personal hx of cancer, or hx of DVT/PE.  She states she has a history of a heart murmur.     HPI     Past Medical History:  Diagnosis Date   Asthma    Heart murmur     There are no problems to display for this patient.   Past Surgical History:  Procedure Laterality Date   KNEE ARTHROSCOPY Left 2018     OB History    Gravida  1   Para      Term      Preterm      AB      Living        SAB      TAB      Ectopic      Multiple      Live Births              Family History  Problem Relation Age of Onset   Depression Mother    Anxiety disorder Mother    Post-traumatic stress disorder Father    Liver disease Father    Multiple sclerosis Sister    Seizures Sister    Depression Sister    Depression Maternal Grandmother        accidental overdose   Post-traumatic stress disorder Maternal Grandfather    Hypertension Maternal Grandfather    Heart disease Maternal Grandfather     Social History   Tobacco Use   Smoking status: Never  Smoker   Smokeless tobacco: Never Used  Substance Use Topics   Alcohol use: Never   Drug use: Never    Home Medications Prior to Admission medications   Medication Sig Start Date End Date Taking? Authorizing Provider  Albuterol Sulfate, sensor, (PROAIR DIGIHALER) 108 (90 Base) MCG/ACT AEPB Inhale into the lungs.    [provider]  buPROPion (WELLBUTRIN XL) 150 MG 24 hr tablet Take 150 mg by mouth daily.    [provider]  EPINEPHrine 0.3 mg/0.3 mL IJ SOAJ injection  07/17/18   [provider]  esomeprazole (NEXIUM) 20 MG capsule Take 20 mg by mouth daily. 06/04/18   [provider]  linaclotide (LINZESS) 72 MCG capsule Take 72 mcg by mouth daily before breakfast.    [provider]  montelukast (SINGULAIR) 10 MG tablet Take 10 mg by mouth daily.    [provider]  nortriptyline (PAMELOR) 10 MG capsule Take 1 capsule (10 mg total) by mouth at bedtime. 06/10/18   Everlena Cooper, Adam R, DO  ondansetron (ZOFRAN) 4 MG tablet Take 8 mg by  mouth 3 (three) times daily as needed. 03/30/18   [provider]    Allergies    Bee venom, Benadryl [diphenhydramine], Dust mite extract, and Pollen extract  Review of Systems   Review of Systems  Constitutional: Negative for chills, diaphoresis and fever.  Respiratory: Positive for shortness of breath (Resolved at present.). Negative for cough.   Cardiovascular: Positive for chest pain. Negative for leg swelling.  Gastrointestinal: Negative for abdominal pain, diarrhea, nausea and vomiting.  All other systems reviewed and are negative.   Physical Exam Updated Vital Signs BP 134/85 (BP Location: Left Arm)    Pulse 90    Temp 98.3 F (36.8 C) (Oral)    Resp 16    Ht 5\' 5"  (1.651 m)    Wt 63.5 kg    LMP 12/06/2019    SpO2 100%    BMI 23.30 kg/m   Physical Exam Vitals and nursing note reviewed.  Constitutional:      General: She is not in acute distress.    Appearance: She is well-developed.  She is not toxic-appearing.  HENT:     Head: Normocephalic and atraumatic.  Eyes:     General:        Right eye: No discharge.        Left eye: No discharge.     Conjunctiva/sclera: Conjunctivae normal.  Cardiovascular:     Rate and Rhythm: Normal rate and regular rhythm.     Pulses:          Radial pulses are 2+ on the right side and 2+ on the left side.  Pulmonary:     Effort: Pulmonary effort is normal. No respiratory distress.     Breath sounds: Normal breath sounds. No wheezing, rhonchi or rales.  Chest:     Chest wall: Tenderness (Anterior chest wall.) present.  Abdominal:     General: There is no distension.     Palpations: Abdomen is soft.     Tenderness: There is no abdominal tenderness.  Musculoskeletal:     Cervical back: Neck supple.     Right lower leg: No edema.     Left lower leg: No edema.  Skin:    General: Skin is warm and dry.     Findings: No rash.  Neurological:     Mental Status: She is alert.     Comments: Clear speech.   Psychiatric:        Behavior: Behavior normal.     ED Results / Procedures / Treatments   Labs (all labs ordered are listed, but only abnormal results are displayed) Labs Reviewed  CBC - Abnormal; Notable for the following components:      Result Value   Hemoglobin 11.5 (*)    All other components within normal limits  BASIC METABOLIC PANEL  I-STAT BETA HCG BLOOD, ED ([facility], [facility], [facility] ONLY)  TROPONIN I (HIGH SENSITIVITY)  TROPONIN I (HIGH SENSITIVITY)    EKG EKG Interpretation  Date/Time:  Monday December 27 2019 12:55:07 EDT Ventricular Rate:  70 PR Interval:  140 QRS Duration: 80 QT Interval:  376 QTC Calculation: 406 R Axis:   75 Text Interpretation: Normal sinus rhythm Nonspecific T wave abnormality Abnormal ECG No old tracing to compare Confirmed by [id number] [phone]) on 12/27/2019 2:17:11 PM   Radiology DG Chest 2 View  Result Date: 12/27/2019 CLINICAL DATA:  Chest pain EXAM: CHEST - 2 VIEW COMPARISON:  None.  FINDINGS: The heart size and mediastinal contours are within normal limits.  Both lungs are clear. No pleural effusions. No pneumothorax. The visualized skeletal structures are unremarkable. IMPRESSION: No acute cardiopulmonary disease. Electronically Signed   By: Feliberto Harts MD   On: 12/27/2019 13:14    Procedures Procedures (including critical care time)  Medications Ordered in ED Medications  naproxen (NAPROSYN) tablet 500 mg (500 mg Oral Given 12/27/19 1433)    ED Course  I have reviewed the triage vital signs and the nursing notes.  Pertinent labs & imaging results that were available during my care of the patient were reviewed by me and considered in my medical decision making (see chart for details).    MDM Rules/Calculators/A&P                          Patient presents to the emergency department with chest pain. Patient nontoxic appearing, in no apparent distress, vitals without significant abnormality.  Chest pain is reproducible with anterior chest wall palpation, otherwise exam is fairly benign DDX: ACS, pulmonary embolism, dissection, pneumothorax, pneumonia, arrhythmia, severe anemia, MSK, GERD, anxiety.   Additional history obtained:  Additional history obtained from chart review and nursing note reviewed.   EKG: No STEMI.  Nonspecific T wave changes.  Lab Tests:  I reviewed and interpreted labs, which included:  CBC, BMP, pregnancy test, troponins: Unremarkable.  Mild anemia, consider recheck.  Troponins are flat.  Imaging Studies ordered:  CXR ordered by triage, I independently visualized and interpreted imaging which showed no significant abnormality.   Patient was given naproxen in the emergency department, on reassessment her pain is resolved, she feels ready to go home at this time.  HEAR score low risk- EKG no STEMI, delta troponin negative, doubt ACS. Patient is low risk wells, PERC negative, doubt pulmonary embolism. Pain is not a tearing sensation,  symmetric pulses, no widening of mediastinum on CXR, doubt dissection. Labs & CXR reassuring. Patient has appeared hemodynamically stable throughout ER visit and appears safe for discharge with close PCP/cardiology follow up. I discussed results, treatment plan, need for PCP follow-up, and return precautions with the patient. Provided opportunity for questions, patient confirmed understanding and is in agreement with plan.   Portions of this note were generated with Scientist, clinical (histocompatibility and immunogenetics). Dictation errors may occur despite best attempts at proofreading.    Final Clinical Impression(s) / ED Diagnoses Final diagnoses:  Chest pain, unspecified type    Rx / DC Orders ED Discharge Orders         Ordered    naproxen (NAPROSYN) 500 MG tablet  2 times daily PRN        12/27/19 1748           Cherly Anderson, PA-C 12/27/19 1752    Linwood Dibbles, MD 12/28/19 1537

## 2019-12-27 NOTE — ED Triage Notes (Addendum)
Pt reports intermittent pain to chest for several years that is worse today.  States pain started on left side while lying down and then radiated to center and R chest.  Reports mild SOB.  Denies nausea and vomiting.

## 2019-12-27 NOTE — ED Triage Notes (Signed)
Pt here for eval of central chest pain lasting 1.5 hours this morning onset while lying down. Pt endorses similar pain intermittently over the last few years but it never lasts this long.

## 2019-12-27 NOTE — Discharge Instructions (Addendum)
You were seen in the emergency department today for chest pain. Your work-up in the emergency department has been overall reassuring. Your labs have been fairly normal and or similar to previous blood work you have had done, your hemoglobin was mildly low consistent with anemia, habits recheck by your primary care provider within 1 to 2 weeks.. Your EKG and the enzyme we use to check your heart did not show an acute heart attack at this time. Your chest x-ray was normal.   We are sending you home with naproxen to help with pain.  - Naproxen is a nonsteroidal anti-inflammatory medication that will help with pain and swelling. Be sure to take this medication as prescribed with food, 1 pill every 12 hours,  It should be taken with food, as it can cause stomach upset, and more seriously, stomach bleeding. Do not take other nonsteroidal anti-inflammatory medications with this such as Advil, Motrin, Aleve, Mobic, Goodie Powder, or Motrin.    You make take Tylenol per over the counter dosing with these medications.   We have prescribed you new medication(s) today. Discuss the medications prescribed today with your pharmacist as they can have adverse effects and interactions with your other medicines including over the counter and prescribed medications. Seek medical evaluation if you start to experience new or abnormal symptoms after taking one of these medicines, seek care immediately if you start to experience difficulty breathing, feeling of your throat closing, facial swelling, or rash as these could be indications of a more serious allergic reaction     We would like you to follow up closely with your primary care provider and/or the cardiologist provided in your discharge instructions within 1-3 days. Return to the ER immediately should you experience any new or worsening symptoms including but not limited to return of pain, worsened pain, vomiting, shortness of breath, dizziness, lightheadedness,  passing out, or any other concerns that you may have.

## 2020-01-15 NOTE — Progress Notes (Signed)
Cardiology Office Note:    Date:  01/19/2020   ID:  Emma Huffman, DOB Sep 04, 2000, MRN [id number]  PCP:  Patient, No Pcp Per  Cardiologist:  No primary care provider on file.  Electrophysiologist:  None   Referring MD: No ref. provider found   Chief Complaint  Patient presents with  . Chest Pain    History of Present Illness:    Emma Huffman is a 19 y.o. female with a hx of asthma who presents for initial evaluation of chest pain.  She was seen in the ED on 12/27/2019 for chest pain.  High-sensitivity troponins were negative.  PERC negative.  No further work-up recommended in the ED.  She reports that she woke up that morning with sharp pains in the left side of her chest.  Pain then became more of a pressure feeling.  Lasted for about 2 hours.  Describes constant pain, 9-10 out of 10 in intensity.  Pain resolved in the ED.  Reports no further pain until this morning, had brief episode of sharp pains.  Reports she goes to the gym twice per week and lift weights and does weight machines, denies any exertional chest pain.  Sometimes does have to use her inhaler when she exercises.  She denies any lightheadedness or syncope.  Reports that about once per month has palpitations for few seconds.  Denies any lower extremity edema.  Reports she was told she may have had a hole in her heart as a child.  No smoking history.  No history of heart disease in her immediate family  Past Medical History:  Diagnosis Date  . Asthma   . Heart murmur     Past Surgical History:  Procedure Laterality Date  . KNEE ARTHROSCOPY Left 2018    Current Medications: Current Meds  Medication Sig  . Albuterol Sulfate, sensor, (PROAIR DIGIHALER) 108 (90 Base) MCG/ACT AEPB Inhale into the lungs.  Marland Kitchen buPROPion (WELLBUTRIN XL) 150 MG 24 hr tablet Take 150 mg by mouth daily.  Marland Kitchen EPINEPHrine 0.3 mg/0.3 mL IJ SOAJ injection   . esomeprazole (NEXIUM) 20 MG capsule Take 20 mg by mouth daily.  Marland Kitchen linaclotide (LINZESS) 72 MCG  capsule Take 72 mcg by mouth daily before breakfast.  . montelukast (SINGULAIR) 10 MG tablet Take 10 mg by mouth daily.  . naproxen (NAPROSYN) 500 MG tablet Take 1 tablet (500 mg total) by mouth 2 (two) times daily as needed for moderate pain.  . nortriptyline (PAMELOR) 10 MG capsule Take 1 capsule (10 mg total) by mouth at bedtime.  . ondansetron (ZOFRAN) 4 MG tablet Take 8 mg by mouth 3 (three) times daily as needed.     Allergies:   Bee venom, Benadryl [diphenhydramine], Dust mite extract, and Pollen extract   Social History   Socioeconomic History  . Marital status: Single    Spouse name: Not on file  . Number of children: Not on file  . Years of education: Not on file  . Highest education level: Some college, no degree  Occupational History  . Occupation: Consulting civil engineer  Tobacco Use  . Smoking status: Never Smoker  . Smokeless tobacco: Never Used  Substance and Sexual Activity  . Alcohol use: Never  . Drug use: Never  . Sexual activity: Never  Other Topics Concern  . Not on file  Social History Narrative   Patient is right-handed. She lives with her mother in a single level home, steps to enter. She drinks an occasional soda. She is in college  and wants to become a Engineer, drilling.   Social Determinants of Health   Financial Resource Strain:   . Difficulty of Paying Living Expenses: Not on file  Food Insecurity:   . Worried About Programme researcher, broadcasting/film/video in the Last Year: Not on file  . Ran Out of Food in the Last Year: Not on file  Transportation Needs:   . Lack of Transportation (Medical): Not on file  . Lack of Transportation (Non-Medical): Not on file  Physical Activity:   . Days of Exercise per Week: Not on file  . Minutes of Exercise per Session: Not on file  Stress:   . Feeling of Stress : Not on file  Social Connections:   . Frequency of Communication with Friends and Family: Not on file  . Frequency of Social Gatherings with Friends and Family: Not on file  .  Attends Religious Services: Not on file  . Active Member of Clubs or Organizations: Not on file  . Attends Banker Meetings: Not on file  . Marital Status: Not on file     Family History: The patient's family history includes Anxiety disorder in her mother; Depression in her maternal grandmother, mother, and sister; Heart disease in her maternal grandfather; Hypertension in her maternal grandfather; Liver disease in her father; Multiple sclerosis in her sister; Post-traumatic stress disorder in her father and maternal grandfather; Seizures in her sister.  ROS:   Please see the history of present illness.     All other systems reviewed and are negative.  EKGs/Labs/Other Studies Reviewed:    The following studies were reviewed today:  EKG:  EKG is ordered today.  The ekg ordered today demonstrates normal sinus rhythm, rate 90, nonspecific T wave flattening  Recent Labs: 12/27/2019: BUN 7; Creatinine, Ser 0.85; Hemoglobin 11.5; Platelets 238; Potassium 3.8; Sodium 137  Recent Lipid Panel No results found for: CHOL, TRIG, HDL, CHOLHDL, VLDL, LDLCALC, LDLDIRECT  Physical Exam:    VS:  BP 122/74   Pulse 90   Ht 5\' 5"  (1.651 m)   Wt 145 lb 3.2 oz (65.9 kg)   BMI 24.16 kg/m     Wt Readings from Last 3 Encounters:  01/19/20 145 lb 3.2 oz (65.9 kg)  12/27/19 140 lb (63.5 kg)  10/09/18 132 lb (59.9 kg) (57 %, Z= 0.18)*   * Growth percentiles are based on CDC (Girls, 2-20 Years) data.     GEN:  Well nourished, well developed in no acute distress HEENT: Normal NECK: No JVD; No carotid bruits LYMPHATICS: No lymphadenopathy CARDIAC: RRR, 2 out of 6 systolic murmur RESPIRATORY:  Clear to auscultation without rales, wheezing or rhonchi  ABDOMEN: Soft, non-tender, non-distended MUSCULOSKELETAL:  No edema; No deformity  SKIN: Warm and dry NEUROLOGIC:  Alert and oriented x 3 PSYCHIATRIC:  Normal affect   ASSESSMENT:    1. Chest pain of uncertain etiology   2. Murmur      PLAN:    Chest pain: Atypical in description.  Given continuous chest pain with negative troponins in the ED, suspect noncardiac chest pain.  Given age and lack of risk factors, no further cardiac work-up recommended at this time.  Heart murmur: She was told she may have had a hole in her heart as a child.  2 out of 6 systolic murmur on exam.  Suspect benign flow murmur, will check echocardiogram to rule out any structural heart disease  RTC as needed  Medication Adjustments/Labs and Tests Ordered: Current medicines are  reviewed at length with the patient today.  Concerns regarding medicines are outlined above.  Orders Placed This Encounter  Procedures  . EKG 12-Lead  . ECHOCARDIOGRAM COMPLETE   No orders of the defined types were placed in this encounter.   Patient Instructions  Medication Instructions:  The current medical regimen is effective;  continue present plan and medications.  *If you need a refill on your cardiac medications before your next appointment, please call your pharmacy*   Testing/Procedures: Echocardiogram - Your physician has requested that you have an echocardiogram. Echocardiography is a painless test that uses sound waves to create images of your heart. It provides your doctor with information about the size and shape of your heart and how well your heart's chambers and valves are working. This procedure takes approximately one hour. There are no restrictions for this procedure. This will be performed at our [facility] location - [address], [address].    Follow-Up: At [facility], you and your health needs are our priority.  As part of our continuing mission to provide you with exceptional heart care, we have created designated Provider Care Teams.  These Care Teams include your primary Cardiologist (physician) and Advanced Practice Providers (APPs -  Physician Assistants and Nurse Practitioners) who all work together to provide you with the care  you need, when you need it.  We recommend signing up for the patient portal called "MyChart".  Sign up information is provided on this After Visit Summary.  MyChart is used to connect with patients for Virtual Visits (Telemedicine).  Patients are able to view lab/test results, encounter notes, upcoming appointments, etc.  Non-urgent messages can be sent to your provider as well.   To learn more about what you can do with MyChart, go to [email or web address].    Your next appointment:   As needed  The format for your next appointment:   In Person  Provider:   Epifanio Lesches, MD        Signed, Little Ishikawa, MD  01/19/2020 7:57 PM    [facility]

## 2020-01-19 ENCOUNTER — Ambulatory Visit (INDEPENDENT_AMBULATORY_CARE_PROVIDER_SITE_OTHER): Admitting: Cardiology

## 2020-01-19 ENCOUNTER — Encounter: Payer: Self-pay | Admitting: Cardiology

## 2020-01-19 ENCOUNTER — Other Ambulatory Visit: Payer: Self-pay

## 2020-01-19 VITALS — BP 122/74 | HR 90 | Ht 65.0 in | Wt 145.2 lb

## 2020-01-19 DIAGNOSIS — R079 Chest pain, unspecified: Secondary | ICD-10-CM | POA: Diagnosis not present

## 2020-01-19 DIAGNOSIS — R011 Cardiac murmur, unspecified: Secondary | ICD-10-CM | POA: Diagnosis not present

## 2020-01-19 NOTE — Patient Instructions (Signed)
Medication Instructions:  The current medical regimen is effective;  continue present plan and medications.  *If you need a refill on your cardiac medications before your next appointment, please call your pharmacy*   Testing/Procedures: Echocardiogram - Your physician has requested that you have an echocardiogram. Echocardiography is a painless test that uses sound waves to create images of your heart. It provides your doctor with information about the size and shape of your heart and how well your heart's chambers and valves are working. This procedure takes approximately one hour. There are no restrictions for this procedure. This will be performed at our [facility] location - [address].    Follow-Up: At [facility], you and your health needs are our priority.  As part of our continuing mission to provide you with exceptional heart care, we have created designated Provider Care Teams.  These Care Teams include your primary Cardiologist (physician) and Advanced Practice Providers (APPs -  Physician Assistants and Nurse Practitioners) who all work together to provide you with the care you need, when you need it.  We recommend signing up for the patient portal called "MyChart".  Sign up information is provided on this After Visit Summary.  MyChart is used to connect with patients for Virtual Visits (Telemedicine).  Patients are able to view lab/test results, encounter notes, upcoming appointments, etc.  Non-urgent messages can be sent to your provider as well.   To learn more about what you can do with MyChart, go to [email or web address].    Your next appointment:   As needed  The format for your next appointment:   In Person  Provider:   Epifanio Lesches, MD

## 2020-02-04 ENCOUNTER — Ambulatory Visit (HOSPITAL_COMMUNITY): Payer: 59 | Attending: Cardiology

## 2020-02-04 ENCOUNTER — Other Ambulatory Visit: Payer: Self-pay

## 2020-02-04 DIAGNOSIS — R011 Cardiac murmur, unspecified: Secondary | ICD-10-CM

## 2020-02-04 LAB — ECHOCARDIOGRAM COMPLETE
Area-P 1/2: 3.65 cm2
S' Lateral: 3 cm

## 2020-03-04 ENCOUNTER — Emergency Department (HOSPITAL_COMMUNITY)

## 2020-03-04 ENCOUNTER — Other Ambulatory Visit: Payer: Self-pay

## 2020-03-04 ENCOUNTER — Emergency Department (HOSPITAL_COMMUNITY)
Admission: EM | Admit: 2020-03-04 | Discharge: 2020-03-04 | Disposition: A | Discharge status: Home / Self Care | Attending: Emergency Medicine | Admitting: Emergency Medicine

## 2020-03-04 ENCOUNTER — Encounter (HOSPITAL_COMMUNITY): Payer: Self-pay | Admitting: *Deleted

## 2020-03-04 DIAGNOSIS — J45909 Unspecified asthma, uncomplicated: Secondary | ICD-10-CM | POA: Insufficient documentation

## 2020-03-04 DIAGNOSIS — R109 Unspecified abdominal pain: Secondary | ICD-10-CM | POA: Diagnosis present

## 2020-03-04 DIAGNOSIS — N3001 Acute cystitis with hematuria: Secondary | ICD-10-CM | POA: Insufficient documentation

## 2020-03-04 LAB — COMPREHENSIVE METABOLIC PANEL
ALT: 14 U/L (ref 0–44)
AST: 18 U/L (ref 15–41)
Albumin: 4.1 g/dL (ref 3.5–5.0)
Alkaline Phosphatase: 47 U/L (ref 38–126)
Anion gap: 10 (ref 5–15)
BUN: 8 mg/dL (ref 6–20)
CO2: 26 mmol/L (ref 22–32)
Calcium: 9.1 mg/dL (ref 8.9–10.3)
Chloride: 100 mmol/L (ref 98–111)
Creatinine, Ser: 0.85 mg/dL (ref 0.44–1.00)
GFR, Estimated: >60 mL/min (ref >60)
Glucose, Bld: 113 mg/dL — ABNORMAL HIGH (ref 70–99)
Potassium: 3.6 mmol/L (ref 3.5–5.1)
Sodium: 136 mmol/L (ref 135–145)
Total Bilirubin: 0.8 mg/dL (ref 0.3–1.2)
Total Protein: 7.5 g/dL (ref 6.5–8.1)

## 2020-03-04 LAB — URINALYSIS, ROUTINE W REFLEX MICROSCOPIC
Bilirubin Urine: NEGATIVE
Glucose, UA: NEGATIVE mg/dL
Ketones, ur: NEGATIVE mg/dL
Nitrite: NEGATIVE
Protein, ur: 100 mg/dL — AB
RBC / HPF: >50 RBC/hpf — ABNORMAL HIGH (ref 0–5)
Specific Gravity, Urine: 1.02 (ref 1.005–1.030)
pH: 7 (ref 5.0–8.0)

## 2020-03-04 LAB — CBC
HCT: 37.7 % (ref 36.0–46.0)
Hemoglobin: 11.9 g/dL — ABNORMAL LOW (ref 12.0–15.0)
MCH: 26.7 pg (ref 26.0–34.0)
MCHC: 31.6 g/dL (ref 30.0–36.0)
MCV: 84.7 fL (ref 80.0–100.0)
Platelets: 259 10*3/uL (ref 150–400)
RBC: 4.45 MIL/uL (ref 3.87–5.11)
RDW: 13.2 % (ref 11.5–15.5)
WBC: 6.7 10*3/uL (ref 4.0–10.5)
nRBC: 0 % (ref 0.0–0.2)

## 2020-03-04 LAB — LIPASE, BLOOD: Lipase: 24 U/L (ref 11–51)

## 2020-03-04 LAB — I-STAT BETA HCG BLOOD, ED (MC, WL, AP ONLY): I-stat hCG, quantitative: <5 m[IU]/mL (ref <5)

## 2020-03-04 IMAGING — CT CT RENAL STONE PROTOCOL
2 of 4 series · 16 of 46 positions shown, 18 images · non-contrast
Comparison: No priors.

CLINICAL DATA: 19-year-old female with history of right-sided
abdominal pain. Suspected kidney stone.

EXAM:
CT ABDOMEN AND PELVIS WITHOUT CONTRAST
TECHNIQUE: Multidetector CT imaging of the abdomen and pelvis was performed
following the standard protocol without IV contrast.

[Series 3: ap without · axial · non-contrast · 0.79mm/px · z∈[+679,+1104]mm · 13 of 97 slices shown, 15 images]
[im 6/97  soft-tissue]
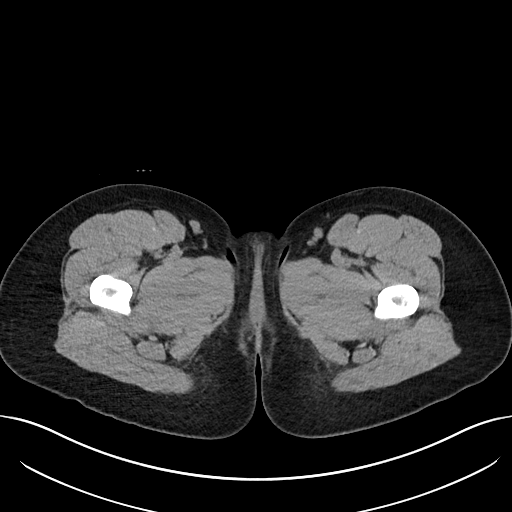
[im 6/97  bone]
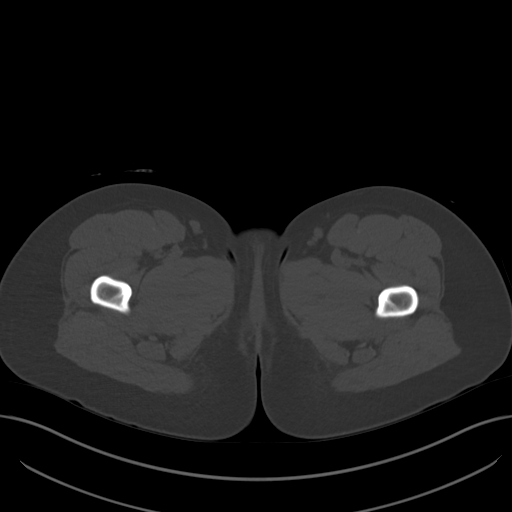
[im 12/97  soft-tissue]
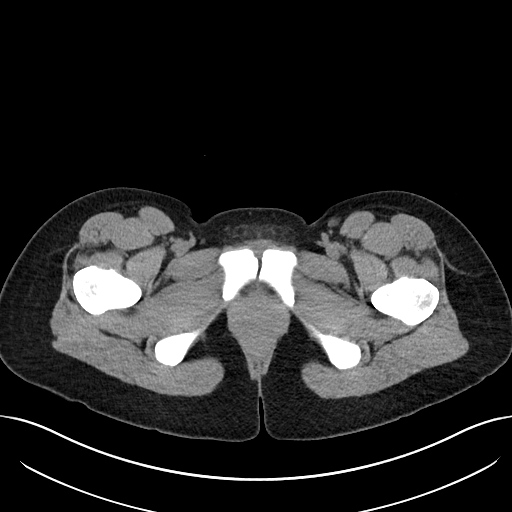
[im 23/97  soft-tissue]
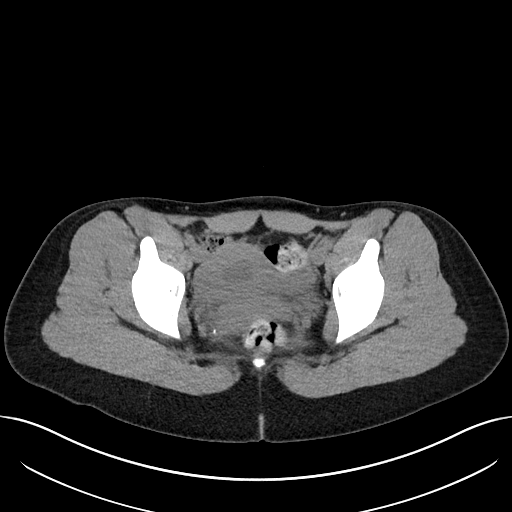
[im 29/97  soft-tissue]
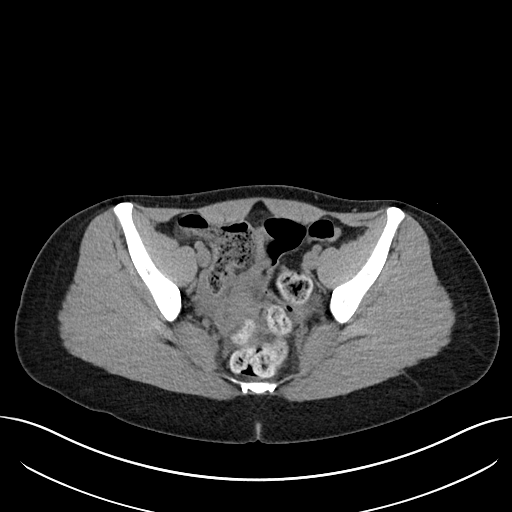
[im 34/97  soft-tissue]
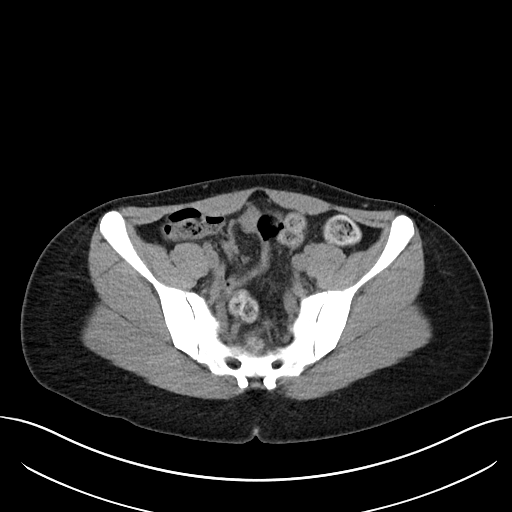
[im 40/97  soft-tissue]
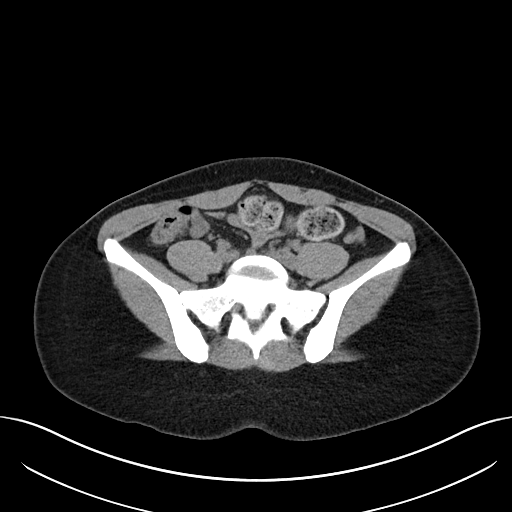
[im 51/97  soft-tissue]
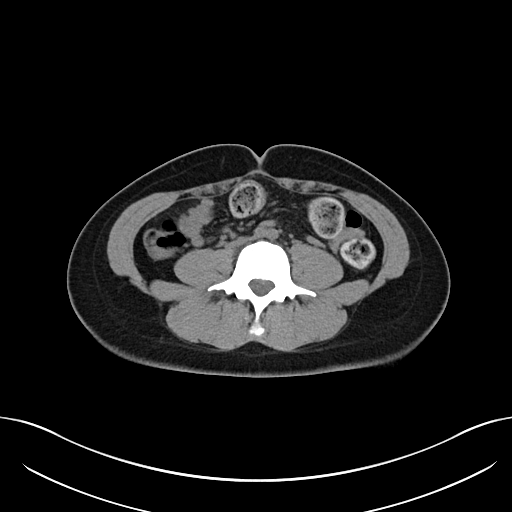
[im 57/97  soft-tissue]
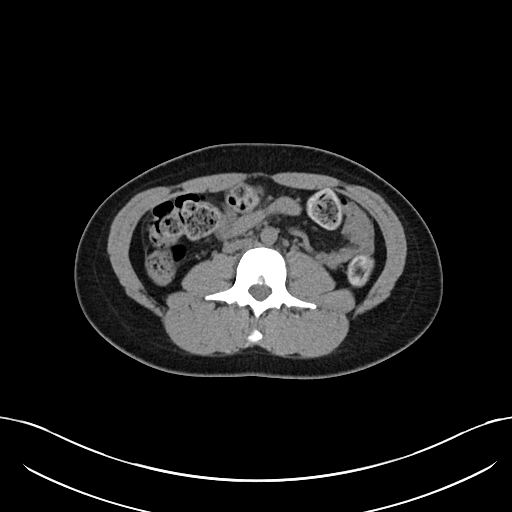
[im 63/97  soft-tissue]
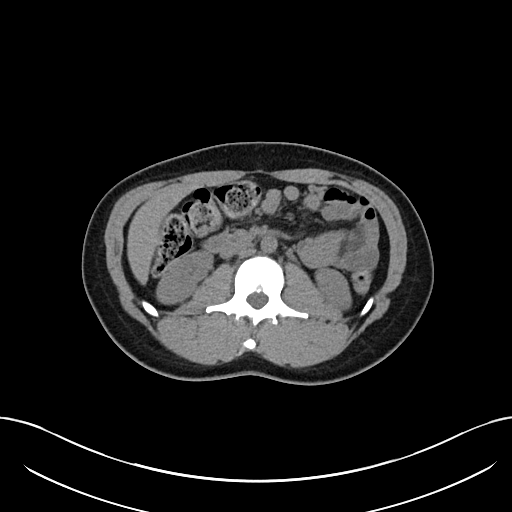
[im 63/97  bone]
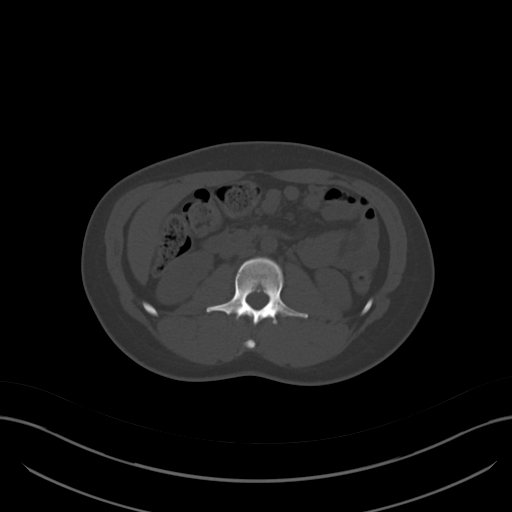
[im 68/97  soft-tissue]
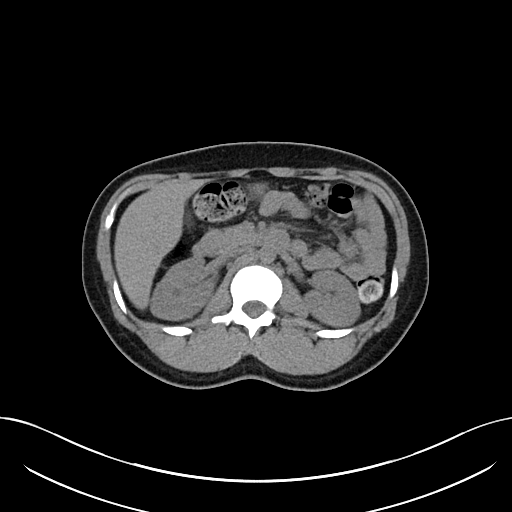
[im 74/97  soft-tissue]
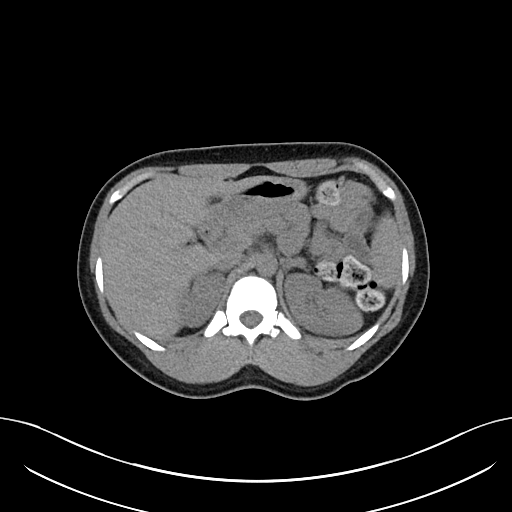
[im 85/97  soft-tissue]
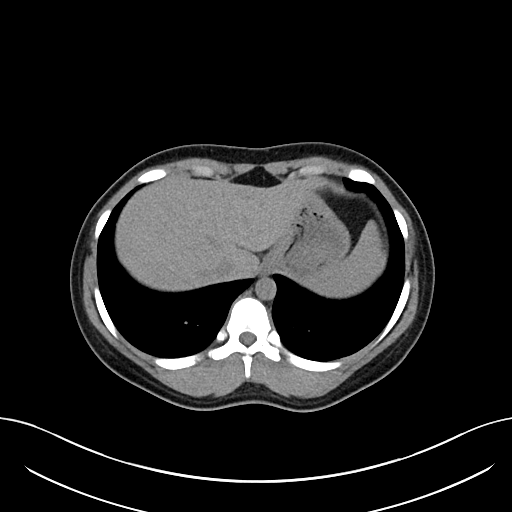
[im 91/97  soft-tissue]
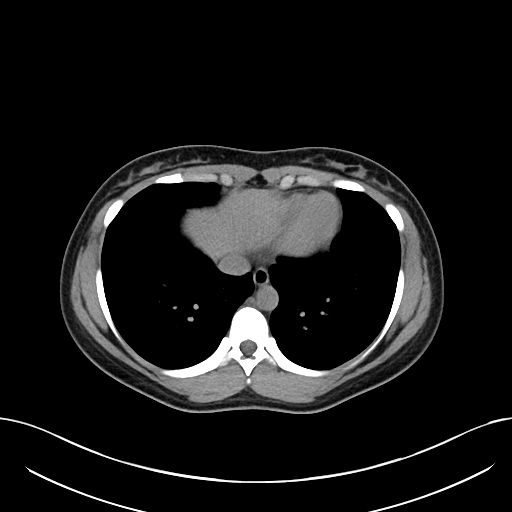

[Series 7: cor · coronal · 0.82mm/px · 3 of 99 slices shown]
[im 33/99  soft-tissue]
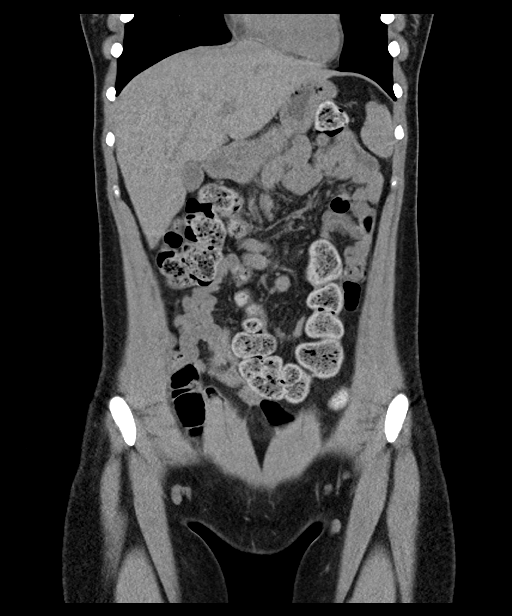
[im 44/99  soft-tissue]
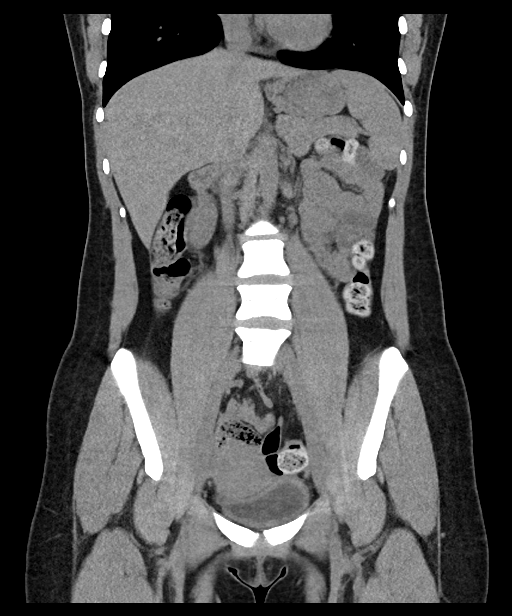
[im 55/99  soft-tissue]
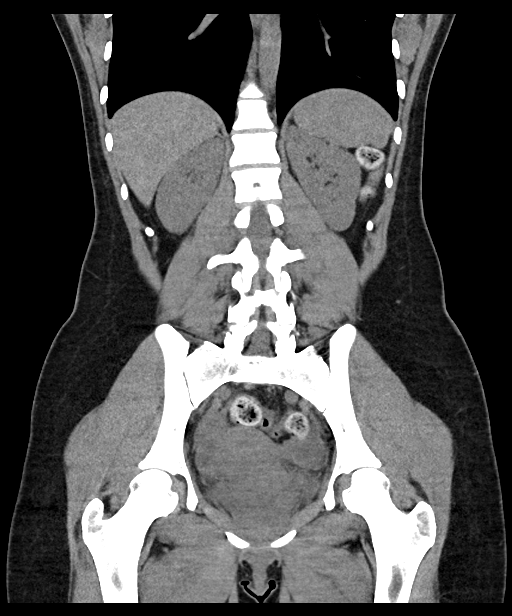

[16 of 46 positions shown; findings below may reference images not displayed]

FINDINGS: Lower chest: Unremarkable.

Hepatobiliary: No definite suspicious cystic or solid hepatic
lesions are confidently identified on today's noncontrast CT
examination. Unenhanced appearance of the gallbladder is normal.

Pancreas: No definite pancreatic mass or peripancreatic fluid
collections or inflammatory changes are noted on today's noncontrast
CT examination.

Spleen: Unremarkable.

Adrenals/Urinary Tract: There are no abnormal calcifications within
the collecting system of either kidney, along the course of either
ureter, or within the lumen of the urinary bladder. No
hydroureteronephrosis or perinephric stranding to suggest urinary
tract obstruction at this time. The unenhanced appearance of the
kidneys is unremarkable bilaterally. Unenhanced appearance of the
urinary bladder is normal. Bilateral adrenal glands are normal in
appearance.

Stomach/Bowel: Unenhanced appearance of the stomach is normal. No
pathologic dilatation of small bowel or colon. Normal appendix.

Vascular/Lymphatic: No atherosclerotic calcifications in the
abdominal aorta or pelvic vasculature. No lymphadenopathy noted in
the abdomen or pelvis.

Reproductive: Uterus and ovaries are unremarkable in appearance.

Other: Trace amount of free fluid in the cul-de-sac, presumably
physiologic in this young female patient. No larger volume of
ascites. No pneumoperitoneum.

Musculoskeletal: There are no aggressive appearing lytic or blastic
lesions noted in the visualized portions of the skeleton.
IMPRESSION: 1. No acute findings are noted in the abdomen or pelvis to account
for the patient's symptoms. Specifically, no urinary tract calculi
no findings of urinary tract obstruction are noted at this time.

## 2020-03-04 MED ORDER — FENTANYL CITRATE (PF) 100 MCG/2ML IJ SOLN
50.0000 ug | Freq: Once | INTRAMUSCULAR | Status: AC
  Administered 2020-03-04: 50 ug via INTRAVENOUS
  Filled 2020-03-04: qty 2

## 2020-03-04 MED ORDER — ONDANSETRON 4 MG PO TBDP
4.0000 mg | ORAL_TABLET | Freq: Once | ORAL | Status: AC | PRN
  Administered 2020-03-04: 4 mg via ORAL
  Filled 2020-03-04: qty 1

## 2020-03-04 MED ORDER — CEPHALEXIN 500 MG PO CAPS: 500.0000 mg | ORAL_CAPSULE | Freq: Four times a day (QID) | ORAL | 0 refills | Status: DC

## 2020-03-04 MED ORDER — ONDANSETRON 4 MG PO TBDP: 4.0000 mg | ORAL_TABLET | Freq: Three times a day (TID) | ORAL | 0 refills | Status: AC | PRN

## 2020-03-04 MED ORDER — ONDANSETRON HCL 4 MG/2ML IJ SOLN
4.0000 mg | Freq: Once | INTRAMUSCULAR | Status: AC
  Administered 2020-03-04: 4 mg via INTRAVENOUS
  Filled 2020-03-04: qty 2

## 2020-03-04 MED ORDER — SULFAMETHOXAZOLE-TRIMETHOPRIM 800-160 MG PO TABS: 1.0000 | ORAL_TABLET | Freq: Two times a day (BID) | ORAL | 0 refills | Status: AC

## 2020-03-04 NOTE — ED Notes (Signed)
Lab called and notified of urine culture being added.

## 2020-03-04 NOTE — Discharge Instructions (Addendum)
You are seen today for urinary tract infection, this could be early signs of pyelonephritis as he spoke about.  Please take the antibiotics as directed, you can take the nausea medication as prescribed as well.  You can take ibuprofen or Tylenol as prescribed on the bottle for pain.  Please come back to the emerge department for any new or worsening concerning symptoms.  Please follow-up with your primary care in the next couple of days.  If your symptoms do not resolve in the next 2 to 3 days please have this followed up by either coming or going to your primary care doctor.  Please talk to your pharmacist about any new medications or side effects and interactions for medications prescribed today.

## 2020-03-04 NOTE — ED Provider Notes (Signed)
[facility] EMERGENCY DEPARTMENT Provider Note   CSN: [id number] Arrival date & time: 03/04/20  0434     History Chief Complaint  Patient presents with  . Abdominal Pain    Nalda Shackleford is a 19 y.o. female with pertinent past medical history of asthma that presents the emerge department today for right-sided flank pain.  Patient states that it started last night and continued throughout the night.  Is constant on the right side of her back and radiates down to the right side of her abdomen.  States that pain is an 8/10 and is sharp.  Also admits to nausea, no vomiting.  Also has urinary frequency, no dysuria or hematuria.  Denies any fevers, diarrhea, vomiting, chest pain, shortness of breath, myalgias, sick contacts.  Denies any abdominal surgeries.  Denies any pelvic complaints-no vaginal bleeding, vaginal discharge, vaginal pain or pelvic pain.  States that she is not concerned about STDs or chance of pregnancy.  Has not taken anything for this.  Has never had a kidney stone before.Marland Kitchen  HPI     Past Medical History:  Diagnosis Date  . Asthma   . Heart murmur     There are no problems to display for this patient.   Past Surgical History:  Procedure Laterality Date  . KNEE ARTHROSCOPY Left 2018     OB History    Gravida  1   Para      Term      Preterm      AB      Living        SAB      TAB      Ectopic      Multiple      Live Births              Family History  Problem Relation Age of Onset  . Depression Mother   . Anxiety disorder Mother   . Post-traumatic stress disorder Father   . Liver disease Father   . Multiple sclerosis Sister   . Seizures Sister   . Depression Sister   . Depression Maternal Grandmother        accidental overdose  . Post-traumatic stress disorder Maternal Grandfather   . Hypertension Maternal Grandfather   . Heart disease Maternal Grandfather     Social History   Tobacco Use  . Smoking status:  Never Smoker  . Smokeless tobacco: Never Used  Substance Use Topics  . Alcohol use: Yes  . Drug use: Never    Home Medications Prior to Admission medications   Medication Sig Start Date End Date Taking? Authorizing Provider  Albuterol Sulfate, sensor, (PROAIR DIGIHALER) 108 (90 Base) MCG/ACT AEPB Inhale into the lungs.    [provider]  buPROPion (WELLBUTRIN XL) 150 MG 24 hr tablet Take 150 mg by mouth daily.    [provider]  EPINEPHrine 0.3 mg/0.3 mL IJ SOAJ injection  07/17/18   [provider]  esomeprazole (NEXIUM) 20 MG capsule Take 20 mg by mouth daily. 06/04/18   [provider]  linaclotide (LINZESS) 72 MCG capsule Take 72 mcg by mouth daily before breakfast.    [provider]  montelukast (SINGULAIR) 10 MG tablet Take 10 mg by mouth daily.    [provider]  naproxen (NAPROSYN) 500 MG tablet Take 1 tablet (500 mg total) by mouth 2 (two) times daily as needed for moderate pain. 12/27/19   Petrucelli, Samantha R, PA-C  nortriptyline (PAMELOR) 10  MG capsule Take 1 capsule (10 mg total) by mouth at bedtime. 06/10/18   Everlena Cooper, Adam R, DO  ondansetron (ZOFRAN ODT) 4 MG disintegrating tablet Take 1 tablet (4 mg total) by mouth every 8 (eight) hours as needed for nausea or vomiting. 03/04/20   Farrel Gordon, PA-C  sulfamethoxazole-trimethoprim (BACTRIM DS) 800-160 MG tablet Take 1 tablet by mouth 2 (two) times daily for 10 days. 03/04/20 03/14/20  Farrel Gordon, PA-C    Allergies    Bee venom, Benadryl [diphenhydramine], Dust mite extract, and Pollen extract  Review of Systems   Review of Systems  Constitutional: Negative for chills, diaphoresis, fatigue and fever.  HENT: Negative for congestion, sore throat and trouble swallowing.   Eyes: Negative for pain and visual disturbance.  Respiratory: Negative for cough, shortness of breath and wheezing.   Cardiovascular: Negative for chest pain, palpitations and leg swelling.    Gastrointestinal: Positive for abdominal pain and nausea. Negative for abdominal distention, diarrhea and vomiting.  Genitourinary: Positive for flank pain and frequency. Negative for decreased urine volume, difficulty urinating, dysuria, hematuria, menstrual problem, pelvic pain, urgency, vaginal bleeding and vaginal discharge.  Musculoskeletal: Negative for back pain, neck pain and neck stiffness.  Skin: Negative for pallor.  Neurological: Negative for dizziness, speech difficulty, weakness and headaches.  Psychiatric/Behavioral: Negative for confusion.    Physical Exam Updated Vital Signs BP 116/90   Pulse 89   Temp 98.2 F (36.8 C) (Oral)   Resp 16   Ht 5\' 5"  (1.651 m)   Wt 63.5 kg   SpO2 99%   BMI 23.30 kg/m   Physical Exam Constitutional:      General: She is not in acute distress.    Appearance: Normal appearance. She is not ill-appearing, toxic-appearing or diaphoretic.     Comments: Appears well, comfortable in bed.  Able to move in all directions without pain.  HENT:     Mouth/Throat:     Mouth: Mucous membranes are moist.     Pharynx: Oropharynx is clear.  Eyes:     General: No scleral icterus.    Extraocular Movements: Extraocular movements intact.     Pupils: Pupils are equal, round, and reactive to light.  Cardiovascular:     Rate and Rhythm: Normal rate and regular rhythm.     Pulses: Normal pulses.     Heart sounds: Normal heart sounds.  Pulmonary:     Effort: Pulmonary effort is normal. No respiratory distress.     Breath sounds: Normal breath sounds. No stridor. No wheezing, rhonchi or rales.  Chest:     Chest wall: No tenderness.  Abdominal:     General: Abdomen is flat. There is no distension.     Palpations: Abdomen is soft.     Tenderness: There is no abdominal tenderness. There is right CVA tenderness (very mild). There is no guarding or rebound.     Comments: Right CVA tenderness with pain wrapping around her right flank.  No true right lower  quadrant pain, no guarding.  No ecchymosis or erythema.  No warmth.  Musculoskeletal:        General: No swelling or tenderness. Normal range of motion.     Cervical back: Normal range of motion and neck supple. No rigidity.     Right lower leg: No edema.     Left lower leg: No edema.  Skin:    General: Skin is warm and dry.     Capillary Refill: Capillary refill takes less than 2 seconds.  Coloration: Skin is not pale.  Neurological:     General: No focal deficit present.     Mental Status: She is alert and oriented to person, place, and time.  Psychiatric:        Mood and Affect: Mood normal.        Behavior: Behavior normal.     ED Results / Procedures / Treatments   Labs (all labs ordered are listed, but only abnormal results are displayed) Labs Reviewed  COMPREHENSIVE METABOLIC PANEL - Abnormal; Notable for the following components:      Result Value   Glucose, Bld 113 (*)    All other components within normal limits  CBC - Abnormal; Notable for the following components:   Hemoglobin 11.9 (*)    All other components within normal limits  URINALYSIS, ROUTINE W REFLEX MICROSCOPIC - Abnormal; Notable for the following components:   APPearance CLOUDY (*)    Hgb urine dipstick MODERATE (*)    Protein, ur 100 (*)    Leukocytes,Ua MODERATE (*)    RBC / HPF >50 (*)    Bacteria, UA RARE (*)    Non Squamous Epithelial 0-5 (*)    All other components within normal limits  URINE CULTURE  LIPASE, BLOOD  I-STAT BETA HCG BLOOD, ED ([facility], [facility], [facility] ONLY)    EKG None  Radiology CT Renal Stone Study  Result Date: 03/04/2020 CLINICAL DATA:  19 year old female with history of right-sided abdominal pain. Suspected kidney stone. EXAM: CT ABDOMEN AND PELVIS WITHOUT CONTRAST TECHNIQUE: Multidetector CT imaging of the abdomen and pelvis was performed following the standard protocol without IV contrast. COMPARISON:  No priors. FINDINGS: Lower chest: Unremarkable. Hepatobiliary: No definite  suspicious cystic or solid hepatic lesions are confidently identified on today's noncontrast CT examination. Unenhanced appearance of the gallbladder is normal. Pancreas: No definite pancreatic mass or peripancreatic fluid collections or inflammatory changes are noted on today's noncontrast CT examination. Spleen: Unremarkable. Adrenals/Urinary Tract: There are no abnormal calcifications within the collecting system of either kidney, along the course of either ureter, or within the lumen of the urinary bladder. No hydroureteronephrosis or perinephric stranding to suggest urinary tract obstruction at this time. The unenhanced appearance of the kidneys is unremarkable bilaterally. Unenhanced appearance of the urinary bladder is normal. Bilateral adrenal glands are normal in appearance. Stomach/Bowel: Unenhanced appearance of the stomach is normal. No pathologic dilatation of small bowel or colon. Normal appendix. Vascular/Lymphatic: No atherosclerotic calcifications in the abdominal aorta or pelvic vasculature. No lymphadenopathy noted in the abdomen or pelvis. Reproductive: Uterus and ovaries are unremarkable in appearance. Other: Trace amount of free fluid in the cul-de-sac, presumably physiologic in this young female patient. No larger volume of ascites. No pneumoperitoneum. Musculoskeletal: There are no aggressive appearing lytic or blastic lesions noted in the visualized portions of the skeleton. IMPRESSION: 1. No acute findings are noted in the abdomen or pelvis to account for the patient's symptoms. Specifically, no urinary tract calculi no findings of urinary tract obstruction are noted at this time. Electronically Signed   By: Trudie Reed M.D.   On: 03/04/2020 10:19    Procedures Procedures (including critical care time)  Medications Ordered in ED Medications  ondansetron (ZOFRAN-ODT) disintegrating tablet 4 mg (4 mg Oral Given 03/04/20 0508)  ondansetron (ZOFRAN) injection 4 mg (4 mg Intravenous  Given 03/04/20 0905)  fentaNYL (SUBLIMAZE) injection 50 mcg (50 mcg Intravenous Given 03/04/20 5427)    ED Course  I have reviewed the triage vital signs and the  nursing notes.  Pertinent labs & imaging results that were available during my care of the patient were reviewed by me and considered in my medical decision making (see chart for details).    MDM Rules/Calculators/A&P                          Dorathy KinsmanJada Groner is a 19 y.o. female with pertinent past medical history of asthma that presents the emerge department today for right-sided flank pain.Differential diagnoses considered include nephrolithiasis, pyelonephritis, MSK, appendicitis, colitis. pelvic pathology less likely since patient is not having pain in that area with no symptoms related to this.  Initial interventions Zofran and fentanyl.  Labs demonstrated normal leukocytosis, CMP unremarkable.  Urinalysis suggestive of UTI or kidney stone with triple phosphate crystals present.  Upon reassessment patient states that she feels more comfortable with fentanyl and Zofran, CT unremarkable, no hydronephrosis or nephrolithiasis.  Shared decision-making about pelvic exam, patient does not think that this is necessary at this time.  Given the above findings, my suspicion is that patient has UTI and early pyelonephritis.  Afebrile, nontachycardic, no leukocytosis, however we will treat appropriately for early pyelonephritis and have patient follow-up with PCP.  Patient agreeable.  Doubt need for further emergent work up at this time. I explained the diagnosis and have given explicit precautions to return to the ER including for any other new or worsening symptoms. The patient understands and accepts the medical plan as it's been dictated and I have answered their questions. Discharge instructions concerning home care and prescriptions have been given. The patient is STABLE and is discharged to home in good condition.    Final Clinical  Impression(s) / ED Diagnoses Final diagnoses:  Acute cystitis with hematuria    Rx / DC Orders ED Discharge Orders         Ordered    cephALEXin (KEFLEX) 500 MG capsule  4 times daily,   Status:  Discontinued        03/04/20 1119    ondansetron (ZOFRAN ODT) 4 MG disintegrating tablet  Every 8 hours PRN        03/04/20 1119    sulfamethoxazole-trimethoprim (BACTRIM DS) 800-160 MG tablet  2 times daily        03/04/20 [address]           Kanchan Gal, PA-C 03/04/20 1127    Gwyneth SproutPlunkett, Whitney, MD 03/05/20 [id number]

## 2020-03-04 NOTE — ED Triage Notes (Signed)
C/o right sided abd. Pain onset 10 hours ago c/o nausea

## 2020-03-04 NOTE — ED Notes (Signed)
Pt to CT

## 2020-03-06 LAB — URINE CULTURE: Culture: >=100000 — AB

## 2020-03-07 ENCOUNTER — Telehealth: Payer: Self-pay

## 2020-03-07 NOTE — Telephone Encounter (Signed)
Post ED Visit - Positive Culture Follow-up  Culture report reviewed by antimicrobial stewardship pharmacist: [facility] []  Nathan Batchelder, Pharm.D. []  , .D., BCPS AQ-ID []  Celedonio Miyamoto, Pharm.D., BCPS []  [address], .D., BCPS []  [place], .D., BCPS, AAHIVP []  Georgina Pillion, Pharm.D., BCPS, AAHIVP []  [address], PharmD, BCPS []  , PharmD, BCPS []  [place], PharmD, BCPS []  [address], PharmD []  , PharmD, BCPS []  Estella Husk, PharmD [facility] []  Lysle Pearl, PharmD []  , PharmD []  Phillips Climes, PharmD []  , Rph []  Agapito Games) , PharmD []  Verlan Friends, PharmD []  , PharmD []  Mervyn Gay, PharmD []  , PharmD []  Vinnie Level, PharmD []  Autumn Patty, PharmD []  , PharmD []  Len Childs, PharmD   Positive urine culture Treated with Bactrim DS, organism sensitive to the same and no further patient follow-up is required at this time.  03/07/2020, 9:27 AM

## 2020-05-28 ENCOUNTER — Other Ambulatory Visit: Payer: Self-pay

## 2020-05-28 ENCOUNTER — Emergency Department (HOSPITAL_COMMUNITY): Payer: 59

## 2020-05-28 ENCOUNTER — Emergency Department (HOSPITAL_COMMUNITY)
Admission: EM | Admit: 2020-05-28 | Discharge: 2020-05-29 | Disposition: A | Discharge status: Home / Self Care | Payer: 59 | Attending: Emergency Medicine | Admitting: Emergency Medicine

## 2020-05-28 DIAGNOSIS — R202 Paresthesia of skin: Secondary | ICD-10-CM | POA: Insufficient documentation

## 2020-05-28 DIAGNOSIS — R109 Unspecified abdominal pain: Secondary | ICD-10-CM | POA: Diagnosis present

## 2020-05-28 DIAGNOSIS — Z20822 Contact with and (suspected) exposure to covid-19: Secondary | ICD-10-CM | POA: Insufficient documentation

## 2020-05-28 DIAGNOSIS — J45909 Unspecified asthma, uncomplicated: Secondary | ICD-10-CM | POA: Insufficient documentation

## 2020-05-28 DIAGNOSIS — K5900 Constipation, unspecified: Secondary | ICD-10-CM | POA: Diagnosis not present

## 2020-05-28 DIAGNOSIS — R1084 Generalized abdominal pain: Secondary | ICD-10-CM

## 2020-05-28 LAB — COMPREHENSIVE METABOLIC PANEL
ALT: 15 U/L (ref 0–44)
AST: 19 U/L (ref 15–41)
Albumin: 4.3 g/dL (ref 3.5–5.0)
Alkaline Phosphatase: 44 U/L (ref 38–126)
Anion gap: 9 (ref 5–15)
BUN: 9 mg/dL (ref 6–20)
CO2: 26 mmol/L (ref 22–32)
Calcium: 9.7 mg/dL (ref 8.9–10.3)
Chloride: 102 mmol/L (ref 98–111)
Creatinine, Ser: 0.8 mg/dL (ref 0.44–1.00)
GFR, Estimated: >60 mL/min (ref >60)
Glucose, Bld: 89 mg/dL (ref 70–99)
Potassium: 3.5 mmol/L (ref 3.5–5.1)
Sodium: 137 mmol/L (ref 135–145)
Total Bilirubin: 0.8 mg/dL (ref 0.3–1.2)
Total Protein: 7.5 g/dL (ref 6.5–8.1)

## 2020-05-28 LAB — CBC
HCT: 38.8 % (ref 36.0–46.0)
Hemoglobin: 12.3 g/dL (ref 12.0–15.0)
MCH: 26.7 pg (ref 26.0–34.0)
MCHC: 31.7 g/dL (ref 30.0–36.0)
MCV: 84.2 fL (ref 80.0–100.0)
Platelets: 268 10*3/uL (ref 150–400)
RBC: 4.61 MIL/uL (ref 3.87–5.11)
RDW: 13 % (ref 11.5–15.5)
WBC: 14.2 10*3/uL — ABNORMAL HIGH (ref 4.0–10.5)
nRBC: 0 % (ref 0.0–0.2)

## 2020-05-28 LAB — LIPASE, BLOOD: Lipase: 23 U/L (ref 11–51)

## 2020-05-28 LAB — I-STAT BETA HCG BLOOD, ED (MC, WL, AP ONLY): I-stat hCG, quantitative: <5 m[IU]/mL (ref <5)

## 2020-05-28 IMAGING — CT CT HEAD W/O CM
4 series · 16 of 47 positions shown, 18 images · non-contrast
Comparison: Brain MRI [DATE]

CLINICAL DATA: Numbness or tingling, paresthesia (Ped 0-18y)

EXAM:
CT HEAD WITHOUT CONTRAST
TECHNIQUE: Contiguous axial images were obtained from the base of the skull
through the vertex without intravenous contrast.

[Series 3: head without · axial · non-contrast · 0.39mm/px · z∈[-68,+32]mm · 7 of 28 slices shown, 9 images]
[im 4/28  brain]
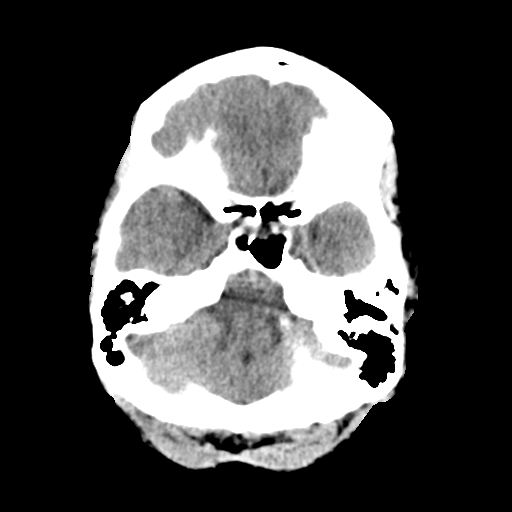
[im 4/28  bone]
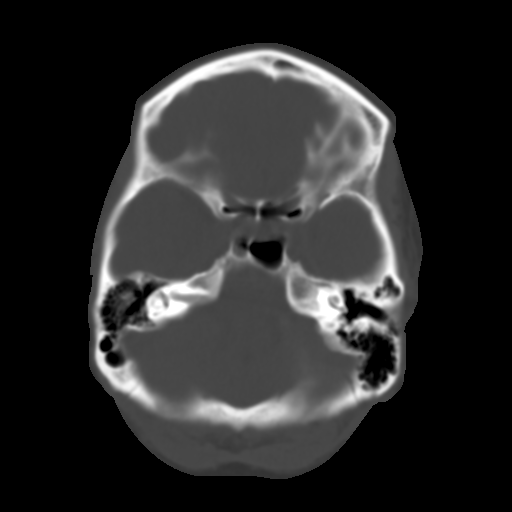
[im 7/28  brain]
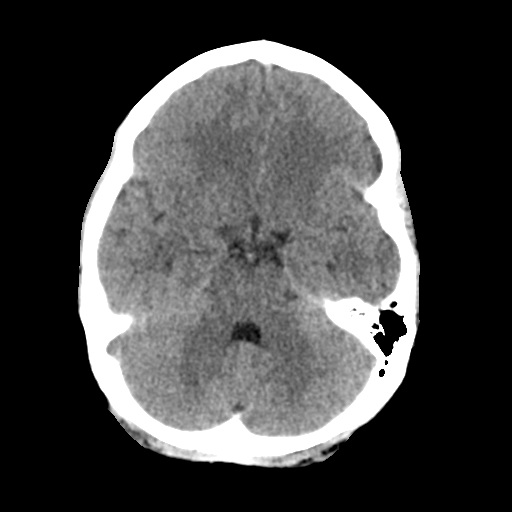
[im 11/28  brain]
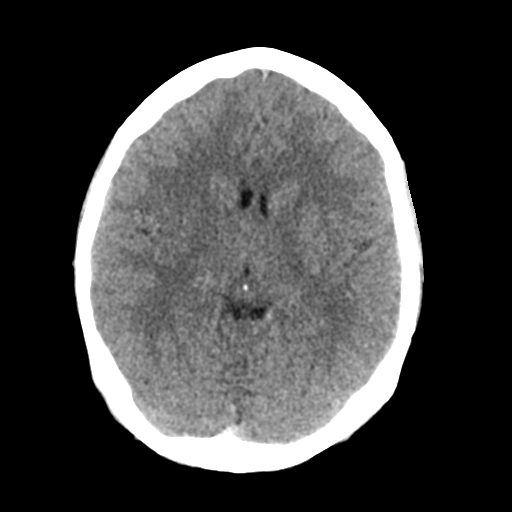
[im 14/28  brain]
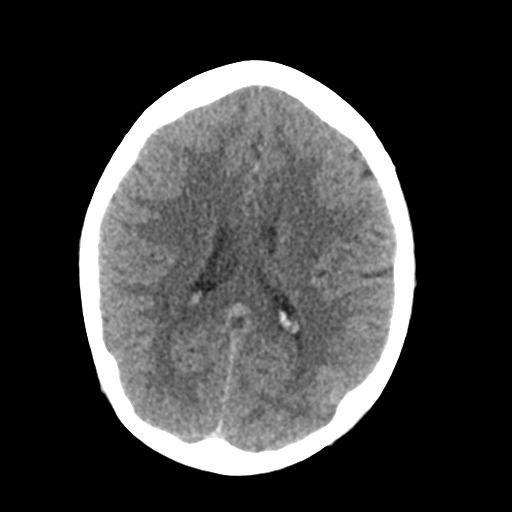
[im 17/28  brain]
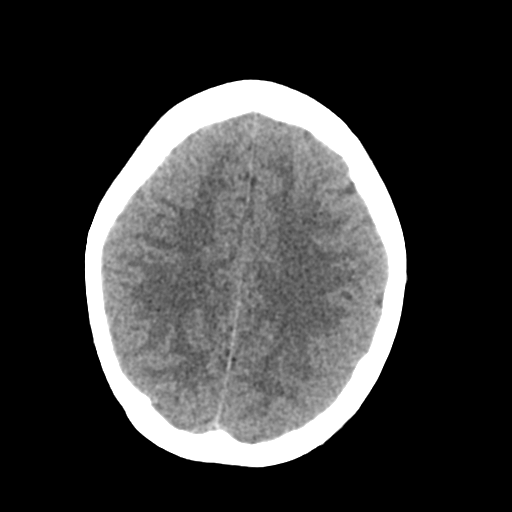
[im 17/28  bone]
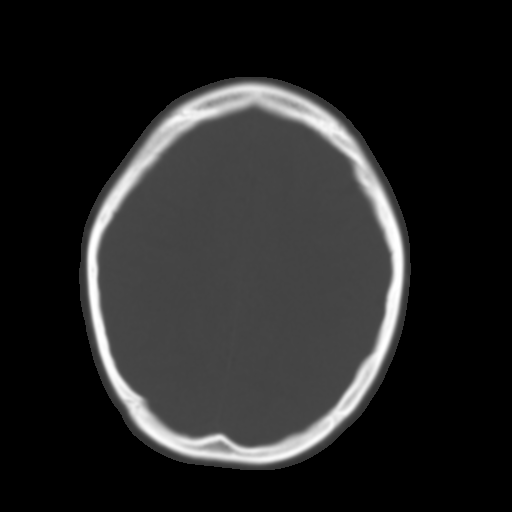
[im 21/28  brain]
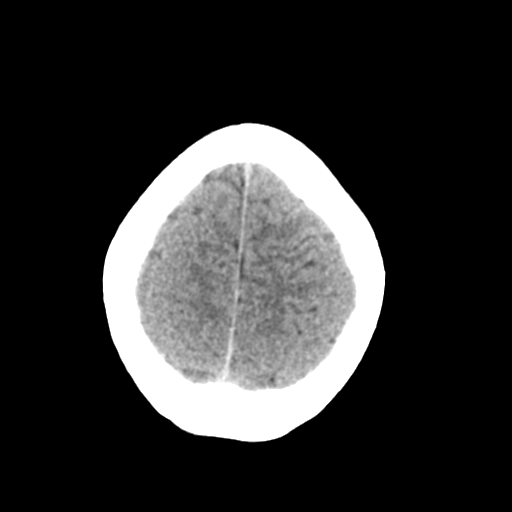
[im 24/28  brain]
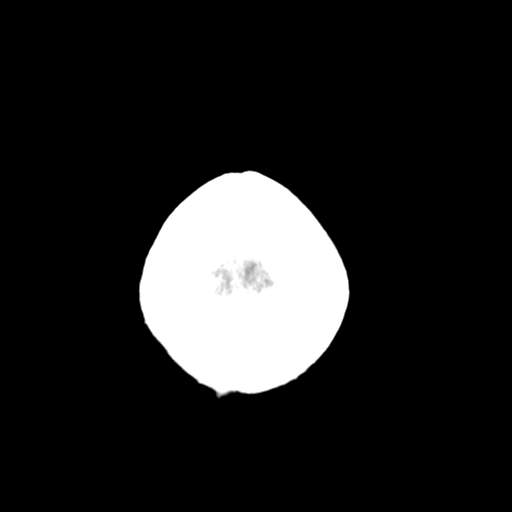

[Series 4: head bone · axial · 0.39mm/px · z∈[-71,-43]mm · 3 of 70 slices shown]
[im 7/70  bone]
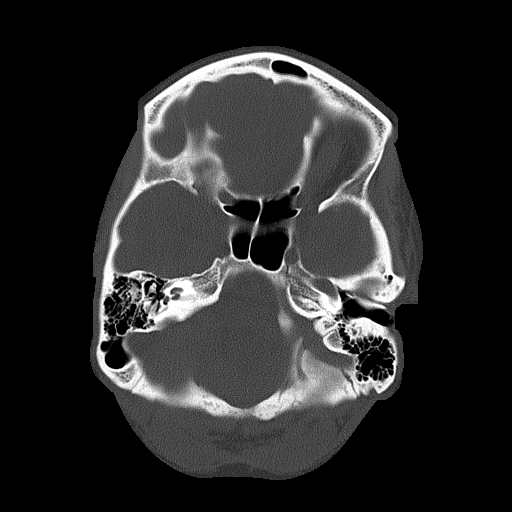
[im 14/70  bone]
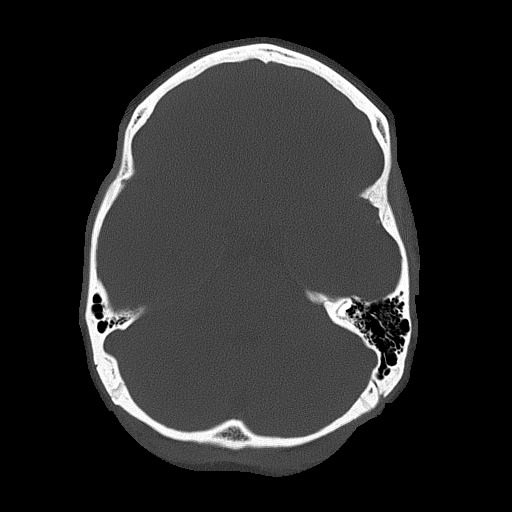
[im 21/70  bone]
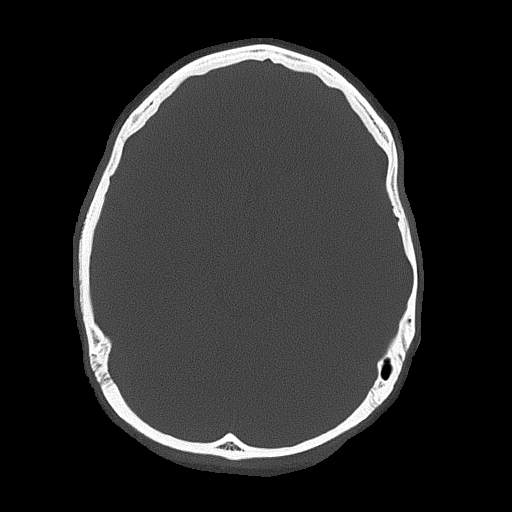

[Series 5: head without cor · coronal · non-contrast · 0.31mm/px · 3 of 67 slices shown]
[im 23/67  brain]
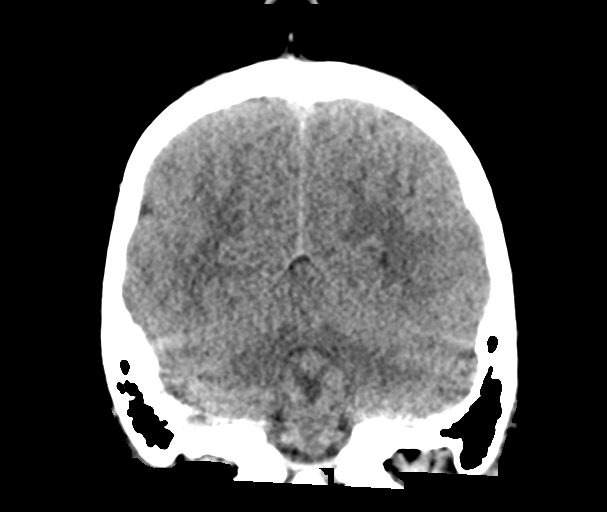
[im 30/67  brain]
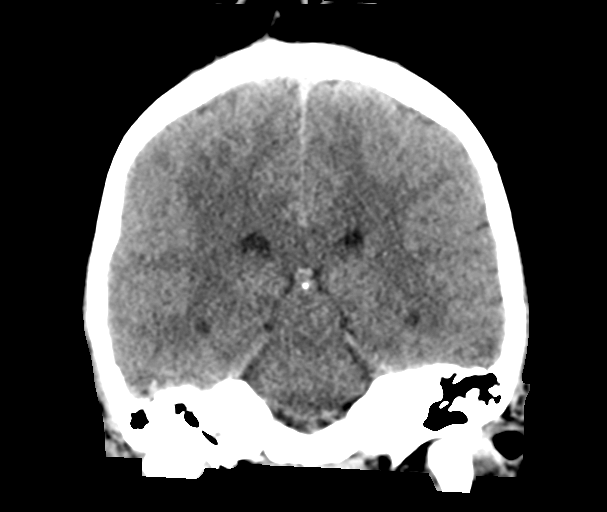
[im 37/67  brain]
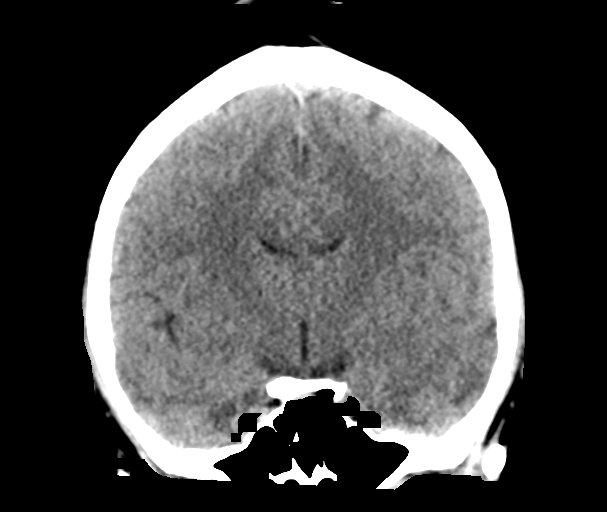

[Series 6: head without sag · sagittal · non-contrast · 0.31mm/px · 3 of 63 slices shown]
[im 21/63  brain]
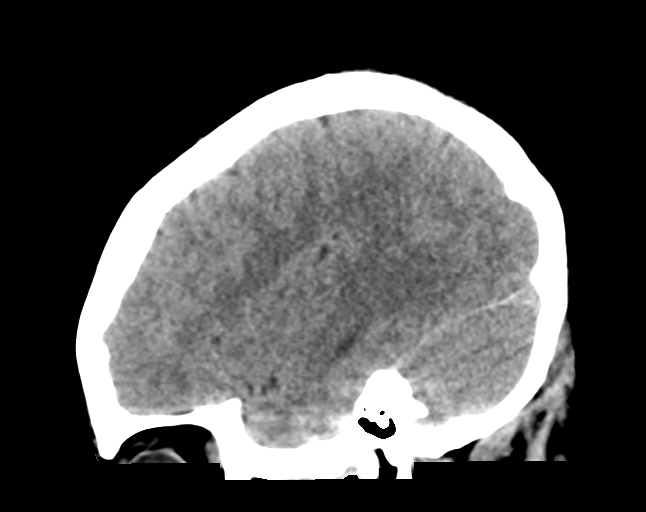
[im 32/63  brain]
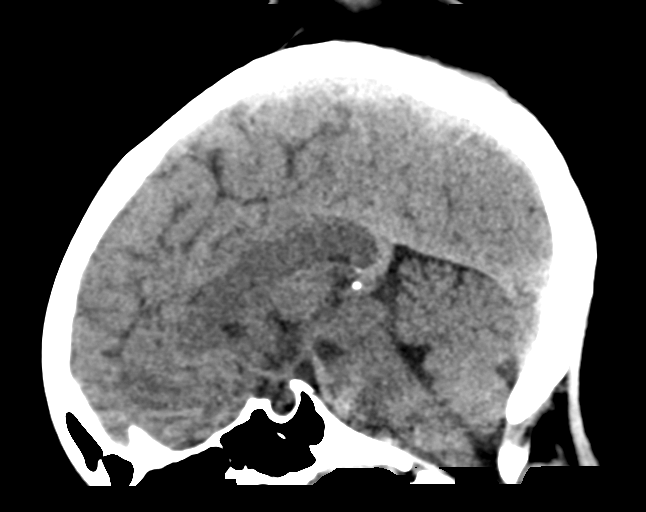
[im 42/63  brain]
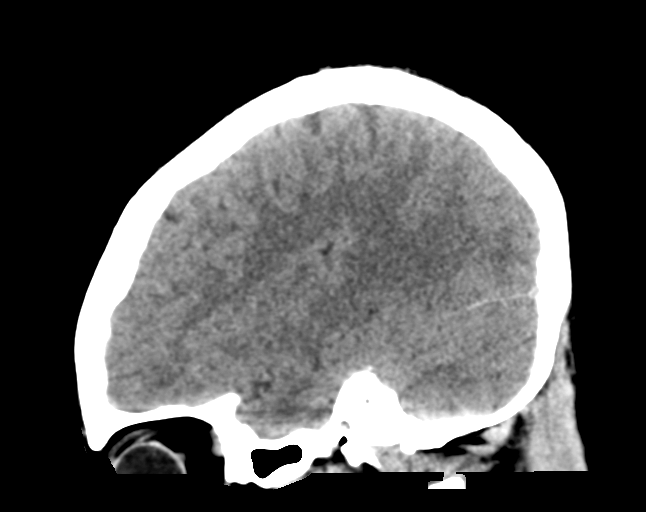

[16 of 47 positions shown; findings below may reference images not displayed]

FINDINGS: Brain: No intracranial hemorrhage, mass effect, or midline shift. No
hydrocephalus. The basilar cisterns are patent. No evidence of
territorial infarct or acute ischemia. No extra-axial or
intracranial fluid collection.

Vascular: No hyperdense vessel or unexpected calcification.

Skull: Normal. Negative for fracture or focal lesion.

Sinuses/Orbits: Paranasal sinuses and mastoid air cells are clear.
The visualized orbits are unremarkable.

Other: None.
IMPRESSION: Negative noncontrast head CT.

## 2020-05-28 IMAGING — CT CT ANGIO CHEST-ABD-PELV FOR DISSECTION W/ AND WO/W CM
2 of 7 series · 14 of 46 positions shown, 16 images · non-contrast
Comparison: CT dated [DATE].

CLINICAL DATA: Pain with concern for dissection.

EXAM:
CT ANGIOGRAPHY CHEST, ABDOMEN AND PELVIS
TECHNIQUE: Non-contrast CT of the chest was initially obtained.

[Series 7: dissection 2mm · axial · 0.82mm/px · z∈[-792,-210]mm · 11 of 329 slices shown, 13 images]
[im 19/329  soft-tissue]
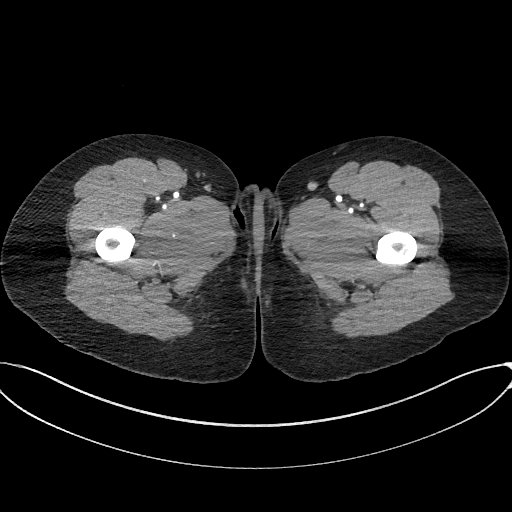
[im 19/329  bone]
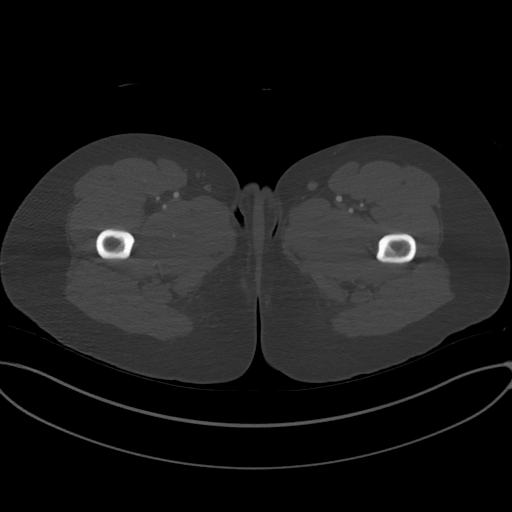
[im 55/329  soft-tissue]
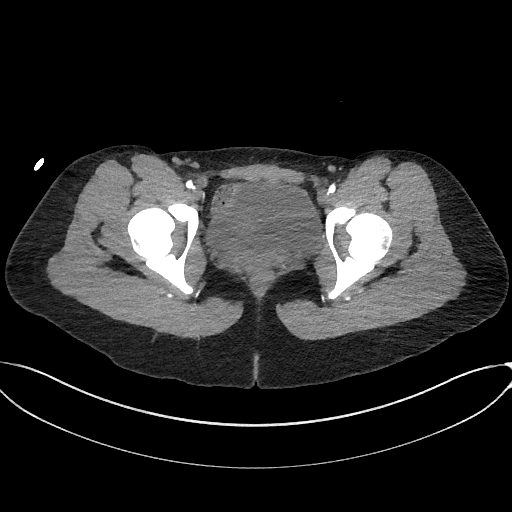
[im 73/329  soft-tissue]
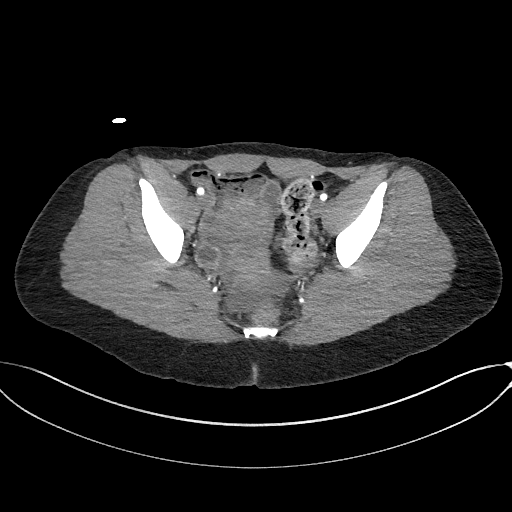
[im 110/329  soft-tissue]
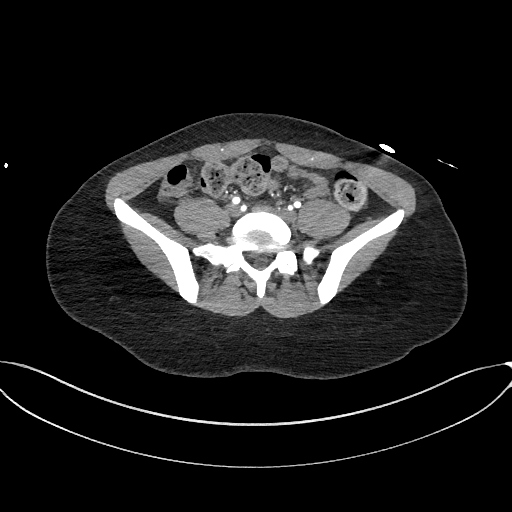
[im 128/329  soft-tissue]
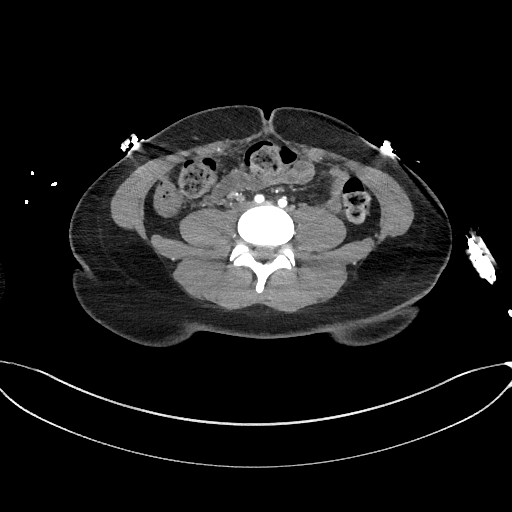
[im 165/329  soft-tissue]
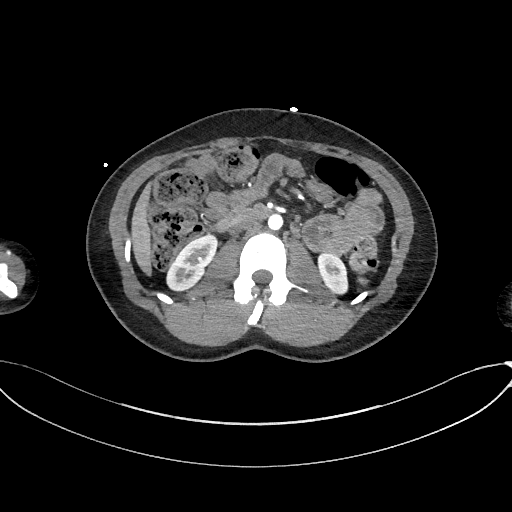
[im 201/329  soft-tissue]
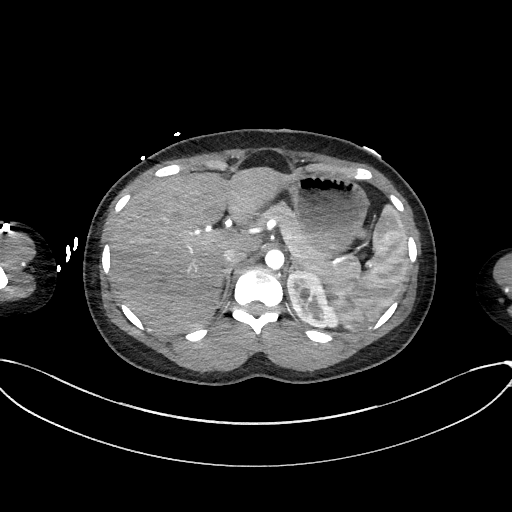
[im 219/329  soft-tissue]
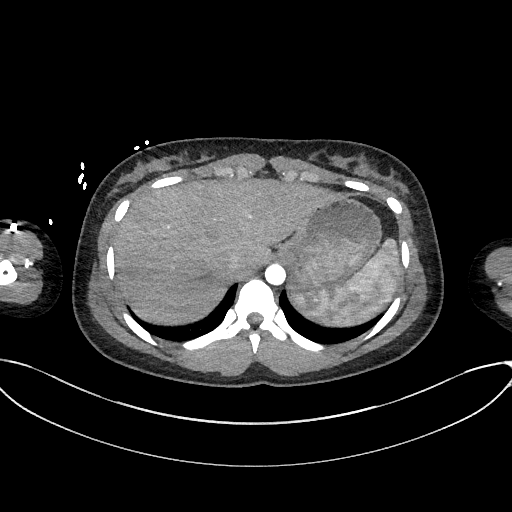
[im 256/329  soft-tissue]
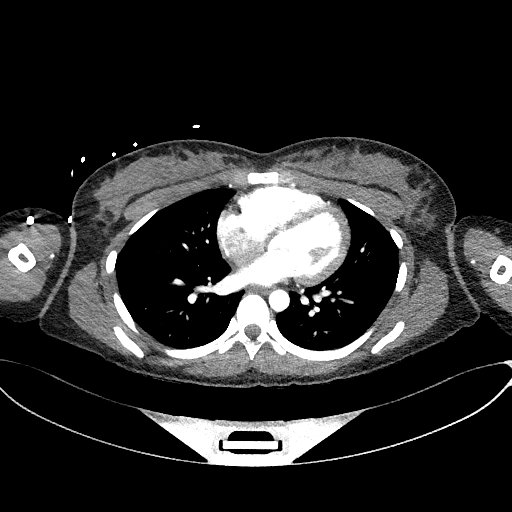
[im 256/329  bone]
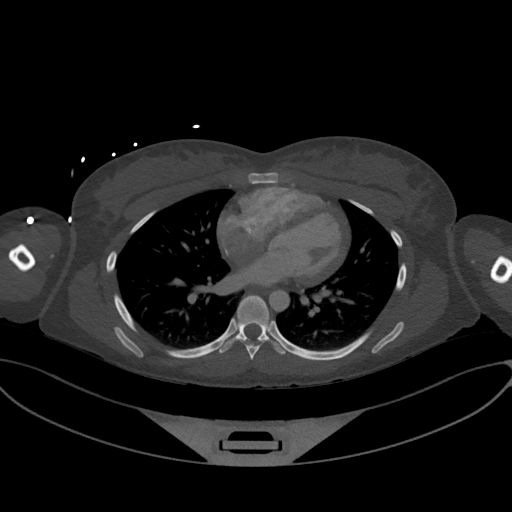
[im 274/329  soft-tissue]
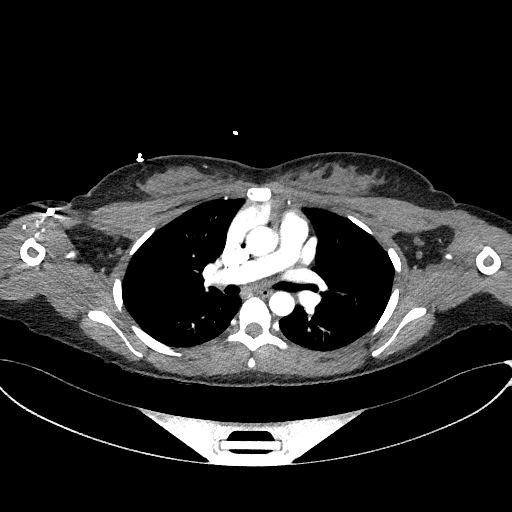
[im 310/329  soft-tissue]
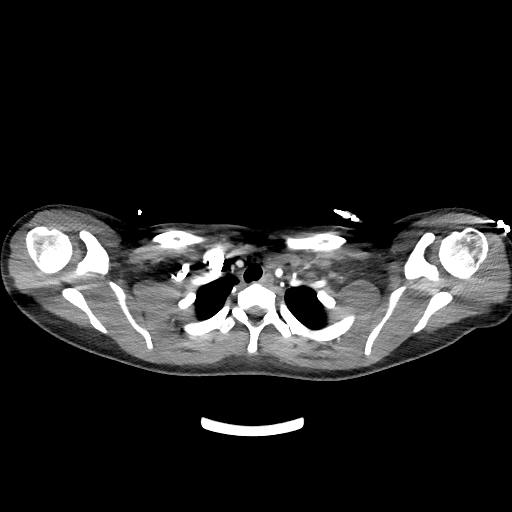

[Series 9: dissection 2mm cor · coronal · 0.92mm/px · 3 of 150 slices shown]
[im 38/150  soft-tissue]
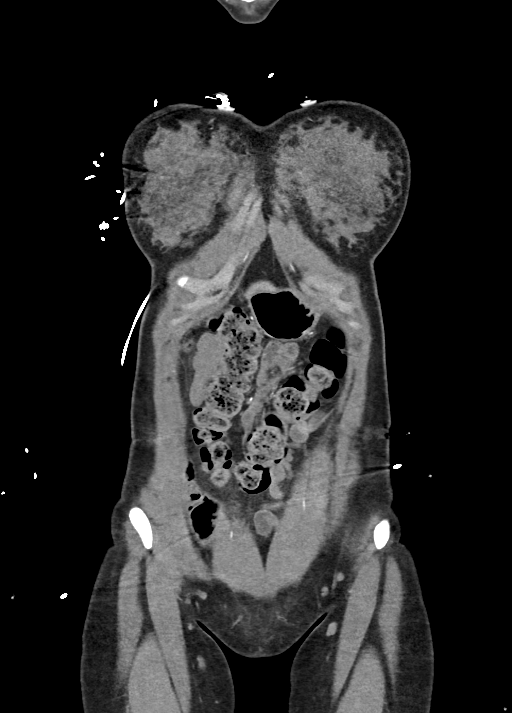
[im 75/150  soft-tissue]
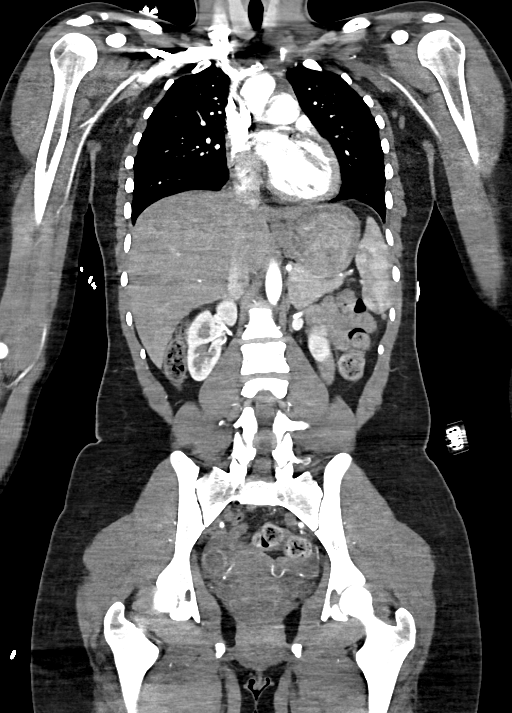
[im 112/150  soft-tissue]
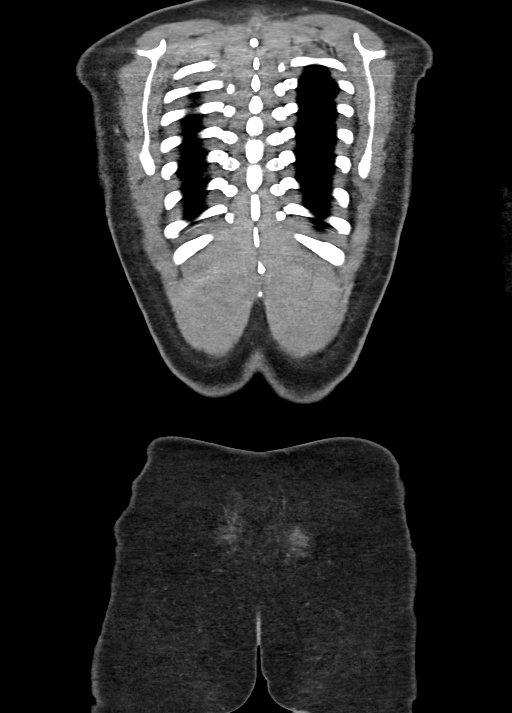

[14 of 46 positions shown; findings below may reference images not displayed]

Multidetector CT imaging through the chest, abdomen and pelvis was
performed using the standard protocol during bolus administration of
intravenous contrast. Multiplanar reconstructed images and MIPs were
obtained and reviewed to evaluate the vascular anatomy.

CONTRAST:  90mL OMNIPAQUE IOHEXOL 350 MG/ML SOLN
FINDINGS: CTA CHEST FINDINGS

Cardiovascular: Contrast injection is sufficient to demonstrate
satisfactory opacification of the pulmonary arteries to the
segmental level. There is no pulmonary embolus or evidence of right
heart strain. The size of the main pulmonary artery is normal. Heart
size is normal, with no pericardial effusion. The course and caliber
of the aorta are normal. There is no atherosclerotic calcification.
Opacification decreased due to pulmonary arterial phase contrast
bolus timing.

Mediastinum/Nodes:

-- No mediastinal lymphadenopathy.

-- No hilar lymphadenopathy.

-- No axillary lymphadenopathy.

-- No supraclavicular lymphadenopathy.

-- Normal thyroid gland where visualized.

-  Unremarkable esophagus.

Lungs/Pleura: Airways are patent. No pleural effusion, lobar
consolidation, pneumothorax or pulmonary infarction.

Musculoskeletal: No chest wall abnormality. No bony spinal canal
stenosis.

Review of the MIP images confirms the above findings.

CTA ABDOMEN AND PELVIS FINDINGS

VASCULAR

Aorta: Normal caliber aorta without aneurysm, dissection, vasculitis
or significant stenosis.

Celiac: Patent without evidence of aneurysm, dissection, vasculitis
or significant stenosis.

SMA: Patent without evidence of aneurysm, dissection, vasculitis or
significant stenosis.

Renals: Both renal arteries are patent without evidence of aneurysm,
dissection, vasculitis, fibromuscular dysplasia or significant
stenosis.

IMA: Patent without evidence of aneurysm, dissection, vasculitis or
significant stenosis.

Inflow: Patent without evidence of aneurysm, dissection, vasculitis
or significant stenosis.

Veins: No obvious venous abnormality within the limitations of this
arterial phase study.

Review of the MIP images confirms the above findings.

NON-VASCULAR

Hepatobiliary: The liver is normal. Normal gallbladder.There is no
biliary ductal dilation.

Pancreas: Normal contours without ductal dilatation. No
peripancreatic fluid collection.

Spleen: Unremarkable.

Adrenals/Urinary Tract:

--Adrenal glands: Unremarkable.

--Right kidney/ureter: No hydronephrosis or radiopaque kidney
stones.

--Left kidney/ureter: No hydronephrosis or radiopaque kidney stones.

--Urinary bladder: Unremarkable.

Stomach/Bowel:

--Stomach/Duodenum: No hiatal hernia or other gastric abnormality.
Normal duodenal course and caliber.

--Small bowel: Unremarkable.

--Colon: Unremarkable.

--Appendix: Normal.

Vascular/Lymphatic: Normal course and caliber of the major abdominal
vessels.

--No retroperitoneal lymphadenopathy.

--No mesenteric lymphadenopathy.

--No pelvic or inguinal lymphadenopathy.

Reproductive: Unremarkable

Other: There is a small volume of pelvic free fluid which is likely
physiologic. No free air. The abdominal wall is normal.

Musculoskeletal. No acute displaced fractures.

Review of the MIP images confirms the above findings.
IMPRESSION: 1. No acute thoracic, abdominal or pelvic pathology. Specifically,
there is no evidence of aortic dissection.
2. Small volume of pelvic free fluid is likely physiologic.

## 2020-05-28 MED ORDER — IOHEXOL 350 MG/ML SOLN
90.0000 mL | Freq: Once | INTRAVENOUS | Status: AC | PRN
  Administered 2020-05-28: 90 mL via INTRAVENOUS

## 2020-05-28 NOTE — ED Notes (Signed)
Pt transported to CT ?

## 2020-05-28 NOTE — ED Triage Notes (Addendum)
Pt presents to ED POV. Pt c/o lower abd pain. Pain began after pt eas unable to have BM. Pt states after pain began she became weak and had tingling in her L arm, and slurred speech. Speech is clear now. NIH - 0. AAO x4. Pt also reports she was seen by cardiologist recently and was told her heart murmur was either worsening or she had leaky valve

## 2020-05-28 NOTE — ED Provider Notes (Signed)
[facility] EMERGENCY DEPARTMENT Provider Note   CSN: [id number] Arrival date & time: 05/28/20  1840     History Chief Complaint  Patient presents with  . Abdominal Pain    Emma Huffman is a 20 y.o. female with a hx of asthma, heart murmur presents to the Emergency Department complaining of gradual, persistent, progressively worsening periumbilical and lower abdominal pain.  Patient reports she attempted to have a bowel movement around 6:30 PM and had to strain significantly to produce 4 small "pellets."  Patient reports that this her abdominal pain worsened significantly and she thought she might vomit but did not.  Patient reports pain was so severe she was unable to walk.  Decision was made to bring her to the emergency department and upon arrival here in the emergency department she developed numbness and tingling of the left arm and leg.  Fianc at bedside reports that patient also had slurred speech at that time.  Patient denies vision changes, syncope.  She denies recent trauma, neck pain.  She reports significant weakness in the left leg and unable to ambulate without assistance.  She continues to have moderate to severe abdominal pain.  Denies previous history of symptoms similar to this.  Both patient and fianc deny anxiety or hyperventilation during this episode.  She is not anticoagulated.  Movement and palpation make her abdominal pain worse.  Nothing seems to make it better.  No specific aggravating or alleviating factors for her paresthesias.  Echocardiogram from 02/04/2020 shows mild mitral valve regurg.  Difficult examination of aortic and pulmonic valves but otherwise normal echocardiogram.  The history is provided by the patient, medical records and a significant other. No language interpreter was used.       Past Medical History:  Diagnosis Date  . Asthma   . Heart murmur     There are no problems to display for this patient.   Past Surgical  History:  Procedure Laterality Date  . KNEE ARTHROSCOPY Left 2018     OB History    Gravida  1   Para      Term      Preterm      AB      Living        SAB      IAB      Ectopic      Multiple      Live Births              Family History  Problem Relation Age of Onset  . Depression Mother   . Anxiety disorder Mother   . Post-traumatic stress disorder Father   . Liver disease Father   . Multiple sclerosis Sister   . Seizures Sister   . Depression Sister   . Depression Maternal Grandmother        accidental overdose  . Post-traumatic stress disorder Maternal Grandfather   . Hypertension Maternal Grandfather   . Heart disease Maternal Grandfather     Social History   Tobacco Use  . Smoking status: Never Smoker  . Smokeless tobacco: Never Used  Substance Use Topics  . Alcohol use: Yes  . Drug use: Never    Home Medications Prior to Admission medications   Medication Sig Start Date End Date Taking? Authorizing Provider  polyethylene glycol (GOLYTELY) 236 g solution Take 2,000 mLs by mouth once for 1 dose. 05/29/20 05/29/20 Yes Amos Micheals, Dahlia Client, PA-C  polyethylene glycol (MIRALAX / GLYCOLAX) 17 g packet Take 17  g by mouth daily. 05/29/20  Yes Enrrique Mierzwa, Dahlia Client, PA-C  Albuterol Sulfate, sensor, (PROAIR DIGIHALER) 108 (90 Base) MCG/ACT AEPB Inhale into the lungs.    [provider]  buPROPion (WELLBUTRIN XL) 150 MG 24 hr tablet Take 150 mg by mouth daily.    [provider]  EPINEPHrine 0.3 mg/0.3 mL IJ SOAJ injection  07/17/18   [provider]  esomeprazole (NEXIUM) 20 MG capsule Take 20 mg by mouth daily. 06/04/18   [provider]  linaclotide (LINZESS) 72 MCG capsule Take 72 mcg by mouth daily before breakfast.    [provider]  montelukast (SINGULAIR) 10 MG tablet Take 10 mg by mouth daily.    [provider]  naproxen (NAPROSYN) 500 MG tablet Take 1 tablet (500 mg total) by mouth 2 (two)  times daily as needed for moderate pain. 12/27/19   Petrucelli, Samantha R, PA-C  nortriptyline (PAMELOR) 10 MG capsule Take 1 capsule (10 mg total) by mouth at bedtime. 06/10/18   Everlena Cooper, Adam R, DO  ondansetron (ZOFRAN ODT) 4 MG disintegrating tablet Take 1 tablet (4 mg total) by mouth every 8 (eight) hours as needed for nausea or vomiting. 03/04/20   Farrel Gordon, PA-C    Allergies    Bee venom, Benadryl [diphenhydramine], Dust mite extract, and Pollen extract  Review of Systems   Review of Systems  Constitutional: Negative for appetite change, diaphoresis, fatigue, fever and unexpected weight change.  HENT: Negative for mouth sores.   Eyes: Negative for visual disturbance.  Respiratory: Negative for cough, chest tightness, shortness of breath and wheezing.   Cardiovascular: Negative for chest pain.  Gastrointestinal: Positive for abdominal pain, constipation and nausea. Negative for diarrhea and vomiting.  Endocrine: Negative for polydipsia, polyphagia and polyuria.  Genitourinary: Negative for dysuria, frequency, hematuria and urgency.  Musculoskeletal: Negative for back pain and neck stiffness.  Skin: Negative for rash.  Allergic/Immunologic: Negative for immunocompromised state.  Neurological: Positive for weakness and numbness. Negative for syncope, light-headedness and headaches.  Hematological: Does not bruise/bleed easily.  Psychiatric/Behavioral: Negative for sleep disturbance. The patient is not nervous/anxious.     Physical Exam Updated Vital Signs BP 124/73 (BP Location: Right Arm)   Pulse 85   Temp 98.6 F (37 C) (Oral)   Resp (!) 23   Ht 5\' 5"  (1.651 m)   Wt 63.5 kg   SpO2 98%   BMI 23.30 kg/m   Physical Exam Vitals and nursing note reviewed.  Constitutional:      General: She is not in acute distress.    Appearance: She is well-developed and well-nourished. She is not diaphoretic.  HENT:     Head: Normocephalic and atraumatic.     Mouth/Throat:     Mouth:  Oropharynx is clear and moist.  Eyes:     General: No scleral icterus.    Extraocular Movements: EOM normal.     Conjunctiva/sclera: Conjunctivae normal.     Pupils: Pupils are equal, round, and reactive to light.     Comments: No horizontal, vertical or rotational nystagmus  Neck:     Comments: Full active and passive ROM without pain No midline or paraspinal tenderness No nuchal rigidity or meningeal signs Cardiovascular:     Rate and Rhythm: Normal rate and regular rhythm.     Pulses: Intact distal pulses.  Pulmonary:     Effort: Pulmonary effort is normal. No respiratory distress.     Breath sounds: Normal breath sounds. No wheezing or rales.  Abdominal:  General: Bowel sounds are normal.     Palpations: Abdomen is soft.     Tenderness: There is abdominal tenderness ( significant) in the periumbilical area and suprapubic area. There is guarding. There is no right CVA tenderness, left CVA tenderness or rebound.  Musculoskeletal:        General: Normal range of motion.     Cervical back: Normal range of motion and neck supple.  Lymphadenopathy:     Cervical: No cervical adenopathy.  Skin:    General: Skin is warm and dry.     Findings: No rash.  Neurological:     Mental Status: She is alert and oriented to person, place, and time.     Cranial Nerves: No cranial nerve deficit.     Motor: No abnormal muscle tone.     Coordination: Coordination normal.     Comments: Mental Status:  Alert, oriented, thought content appropriate. Speech fluent without evidence of aphasia. Able to follow 2 step commands without difficulty.  Cranial Nerves:  II:  Peripheral visual fields grossly normal, pupils equal, round, reactive to light III,IV, VI: ptosis not present, extra-ocular motions intact bilaterally  V,VII: smile symmetric, facial light touch sensation equal VIII: hearing grossly normal bilaterally  IX,X: midline uvula rise  XI: bilateral shoulder shrug equal and strong XII:  midline tongue extension  Motor:  4/5 in left upper and lower extremities bilaterally including grip strength and dorsiflexion/plantar flexion 5/5 in right upper and lower extremities bilaterally including grip strength and dorsiflexion/plantar flexion Sensory: Pinprick and light touch normal in right upper and right lower extremities, subjectively decreased in left upper and left lower extremities.  Cerebellar: normal finger-to-nose with bilateral upper extremities Gait: Deferred as patient reports her left leg is too weak to walk  CV: distal pulses palpable throughout   Psychiatric:        Mood and Affect: Mood and affect normal.        Behavior: Behavior normal.        Thought Content: Thought content normal.        Judgment: Judgment normal.     ED Results / Procedures / Treatments   Labs (all labs ordered are listed, but only abnormal results are displayed) Labs Reviewed  CBC - Abnormal; Notable for the following components:      Result Value   WBC 14.2 (*)    All other components within normal limits  URINALYSIS, ROUTINE W REFLEX MICROSCOPIC - Abnormal; Notable for the following components:   Specific Gravity, Urine >1.046 (*)    Leukocytes,Ua TRACE (*)    All other components within normal limits  SARS CORONAVIRUS 2 (TAT 6-24 HRS)  LIPASE, BLOOD  COMPREHENSIVE METABOLIC PANEL  I-STAT BETA HCG BLOOD, ED ([facility], [facility], [facility] ONLY)    Radiology CT Head Wo Contrast  Result Date: 05/28/2020 CLINICAL DATA:  Numbness or tingling, paresthesia (Ped 0-18y) EXAM: CT HEAD WITHOUT CONTRAST TECHNIQUE: Contiguous axial images were obtained from the base of the skull through the vertex without intravenous contrast. COMPARISON:  Brain MRI 06/16/2018 FINDINGS: Brain: No intracranial hemorrhage, mass effect, or midline shift. No hydrocephalus. The basilar cisterns are patent. No evidence of territorial infarct or acute ischemia. No extra-axial or intracranial fluid collection. Vascular: No hyperdense  vessel or unexpected calcification. Skull: Normal. Negative for fracture or focal lesion. Sinuses/Orbits: Paranasal sinuses and mastoid air cells are clear. The visualized orbits are unremarkable. Other: None. IMPRESSION: Negative noncontrast head CT. Electronically Signed   By: Ivette Loyal.D.  On: 05/28/2020 23:21   MR Brain W and Wo Contrast  Result Date: 05/29/2020 CLINICAL DATA:  Initial evaluation for left-sided numbness, paresthesias. EXAM: MRI HEAD WITHOUT AND WITH CONTRAST MRI CERVICAL SPINE WITHOUT AND WITH CONTRAST TECHNIQUE: Multiplanar, multiecho pulse sequences of the brain and surrounding structures, and cervical spine, to include the craniocervical junction and cervicothoracic junction, were obtained without and with intravenous contrast. CONTRAST:  6.73mL GADAVIST GADOBUTROL 1 MMOL/ML IV SOLN COMPARISON:  Previous brain MRI from 06/16/2018. FINDINGS: MRI HEAD FINDINGS Brain: Examination mildly degraded by motion. Cerebral volume within normal limits for patient age. No focal parenchymal signal abnormality identified. No abnormal foci of restricted diffusion to suggest acute or subacute ischemia. Gray-white matter differentiation well maintained. No encephalomalacia to suggest chronic infarction. No foci of susceptibility artifact to suggest acute or chronic intracranial hemorrhage. No mass lesion, midline shift or mass effect. No hydrocephalus. No extra-axial fluid collection. Major dural sinuses are grossly patent. Pituitary gland and suprasellar region are normal. Midline structures intact and normal. No abnormal enhancement. Vascular: Major intracranial vascular flow voids well maintained and normal in appearance. Skull and upper cervical spine: Craniocervical junction normal. Visualized upper cervical spine within normal limits. Bone marrow signal intensity normal. No scalp soft tissue abnormality. Sinuses/Orbits: Globes and orbital soft tissues within normal limits. Paranasal sinuses  are clear. No mastoid effusion. Inner ear structures normal. Other: None. MRI CERVICAL SPINE FINDINGS Alignment: Examination degraded by motion artifact. Straightening with mild smooth reversal of the normal cervical lordosis. No listhesis. Vertebrae: Vertebral body height maintained without acute or chronic fracture. Bone marrow signal intensity within normal limits for age. No discrete or worrisome osseous lesions. No abnormal marrow edema or enhancement. Cord: Signal intensity within the cervical spinal cord is grossly within normal limits. No convincing cord signal abnormality on this motion degraded exam. No abnormal enhancement. Posterior Fossa, vertebral arteries, paraspinal tissues: Visualized brain and posterior fossa within normal limits. Craniocervical junction normal. Paraspinous and prevertebral soft tissues within normal limits. Normal intravascular flow voids seen within the vertebral arteries bilaterally. Disc levels: C2-C3: Unremarkable. C3-C4:  Unremarkable. C4-C5:  Mild annular disc bulge.  No canal or foraminal stenosis. C5-C6:  Mild annular disc bulge.  No canal or foraminal stenosis. C6-C7:  Unremarkable. C7-T1:  Unremarkable. IMPRESSION: Normal MRIs of the brain and cervical spine. No findings to explain patient's symptoms identified. Electronically Signed   By: Rise Mu M.D.   On: 05/29/2020 03:34   MR CERVICAL SPINE W WO CONTRAST  Result Date: 05/29/2020 CLINICAL DATA:  Initial evaluation for left-sided numbness, paresthesias. EXAM: MRI HEAD WITHOUT AND WITH CONTRAST MRI CERVICAL SPINE WITHOUT AND WITH CONTRAST TECHNIQUE: Multiplanar, multiecho pulse sequences of the brain and surrounding structures, and cervical spine, to include the craniocervical junction and cervicothoracic junction, were obtained without and with intravenous contrast. CONTRAST:  6.51mL GADAVIST GADOBUTROL 1 MMOL/ML IV SOLN COMPARISON:  Previous brain MRI from 06/16/2018. FINDINGS: MRI HEAD FINDINGS Brain:  Examination mildly degraded by motion. Cerebral volume within normal limits for patient age. No focal parenchymal signal abnormality identified. No abnormal foci of restricted diffusion to suggest acute or subacute ischemia. Gray-white matter differentiation well maintained. No encephalomalacia to suggest chronic infarction. No foci of susceptibility artifact to suggest acute or chronic intracranial hemorrhage. No mass lesion, midline shift or mass effect. No hydrocephalus. No extra-axial fluid collection. Major dural sinuses are grossly patent. Pituitary gland and suprasellar region are normal. Midline structures intact and normal. No abnormal enhancement. Vascular: Major intracranial vascular flow voids well  maintained and normal in appearance. Skull and upper cervical spine: Craniocervical junction normal. Visualized upper cervical spine within normal limits. Bone marrow signal intensity normal. No scalp soft tissue abnormality. Sinuses/Orbits: Globes and orbital soft tissues within normal limits. Paranasal sinuses are clear. No mastoid effusion. Inner ear structures normal. Other: None. MRI CERVICAL SPINE FINDINGS Alignment: Examination degraded by motion artifact. Straightening with mild smooth reversal of the normal cervical lordosis. No listhesis. Vertebrae: Vertebral body height maintained without acute or chronic fracture. Bone marrow signal intensity within normal limits for age. No discrete or worrisome osseous lesions. No abnormal marrow edema or enhancement. Cord: Signal intensity within the cervical spinal cord is grossly within normal limits. No convincing cord signal abnormality on this motion degraded exam. No abnormal enhancement. Posterior Fossa, vertebral arteries, paraspinal tissues: Visualized brain and posterior fossa within normal limits. Craniocervical junction normal. Paraspinous and prevertebral soft tissues within normal limits. Normal intravascular flow voids seen within the vertebral  arteries bilaterally. Disc levels: C2-C3: Unremarkable. C3-C4:  Unremarkable. C4-C5:  Mild annular disc bulge.  No canal or foraminal stenosis. C5-C6:  Mild annular disc bulge.  No canal or foraminal stenosis. C6-C7:  Unremarkable. C7-T1:  Unremarkable. IMPRESSION: Normal MRIs of the brain and cervical spine. No findings to explain patient's symptoms identified. Electronically Signed   By: Rise MuBenjamin  McClintock M.D.   On: 05/29/2020 03:34   CT Angio Chest/Abd/Pel for Dissection W and/or Wo Contrast  Result Date: 05/28/2020 CLINICAL DATA:  Pain with concern for dissection. EXAM: CT ANGIOGRAPHY CHEST, ABDOMEN AND PELVIS TECHNIQUE: Non-contrast CT of the chest was initially obtained. Multidetector CT imaging through the chest, abdomen and pelvis was performed using the standard protocol during bolus administration of intravenous contrast. Multiplanar reconstructed images and MIPs were obtained and reviewed to evaluate the vascular anatomy. CONTRAST:  90mL OMNIPAQUE IOHEXOL 350 MG/ML SOLN COMPARISON:  CT dated March 04, 2020. FINDINGS: CTA CHEST FINDINGS Cardiovascular: Contrast injection is sufficient to demonstrate satisfactory opacification of the pulmonary arteries to the segmental level. There is no pulmonary embolus or evidence of right heart strain. The size of the main pulmonary artery is normal. Heart size is normal, with no pericardial effusion. The course and caliber of the aorta are normal. There is no atherosclerotic calcification. Opacification decreased due to pulmonary arterial phase contrast bolus timing. Mediastinum/Nodes: -- No mediastinal lymphadenopathy. -- No hilar lymphadenopathy. -- No axillary lymphadenopathy. -- No supraclavicular lymphadenopathy. -- Normal thyroid gland where visualized. -  Unremarkable esophagus. Lungs/Pleura: Airways are patent. No pleural effusion, lobar consolidation, pneumothorax or pulmonary infarction. Musculoskeletal: No chest wall abnormality. No bony spinal  canal stenosis. Review of the MIP images confirms the above findings. CTA ABDOMEN AND PELVIS FINDINGS VASCULAR Aorta: Normal caliber aorta without aneurysm, dissection, vasculitis or significant stenosis. Celiac: Patent without evidence of aneurysm, dissection, vasculitis or significant stenosis. SMA: Patent without evidence of aneurysm, dissection, vasculitis or significant stenosis. Renals: Both renal arteries are patent without evidence of aneurysm, dissection, vasculitis, fibromuscular dysplasia or significant stenosis. IMA: Patent without evidence of aneurysm, dissection, vasculitis or significant stenosis. Inflow: Patent without evidence of aneurysm, dissection, vasculitis or significant stenosis. Veins: No obvious venous abnormality within the limitations of this arterial phase study. Review of the MIP images confirms the above findings. NON-VASCULAR Hepatobiliary: The liver is normal. Normal gallbladder.There is no biliary ductal dilation. Pancreas: Normal contours without ductal dilatation. No peripancreatic fluid collection. Spleen: Unremarkable. Adrenals/Urinary Tract: --Adrenal glands: Unremarkable. --Right kidney/ureter: No hydronephrosis or radiopaque kidney stones. --Left kidney/ureter: No hydronephrosis or radiopaque  kidney stones. --Urinary bladder: Unremarkable. Stomach/Bowel: --Stomach/Duodenum: No hiatal hernia or other gastric abnormality. Normal duodenal course and caliber. --Small bowel: Unremarkable. --Colon: Unremarkable. --Appendix: Normal. Vascular/Lymphatic: Normal course and caliber of the major abdominal vessels. --No retroperitoneal lymphadenopathy. --No mesenteric lymphadenopathy. --No pelvic or inguinal lymphadenopathy. Reproductive: Unremarkable Other: There is a small volume of pelvic free fluid which is likely physiologic. No free air. The abdominal wall is normal. Musculoskeletal. No acute displaced fractures. Review of the MIP images confirms the above findings. IMPRESSION: 1.  No acute thoracic, abdominal or pelvic pathology. Specifically, there is no evidence of aortic dissection. 2. Small volume of pelvic free fluid is likely physiologic. Electronically Signed   By: Katherine Mantle M.D.   On: 05/28/2020 23:36    Procedures Procedures   Medications Ordered in ED Medications  iohexol (OMNIPAQUE) 350 MG/ML injection 90 mL (90 mLs Intravenous Contrast Given 05/28/20 2326)  sodium chloride 0.9 % bolus 1,000 mL (0 mLs Intravenous Stopped 05/29/20 0357)  gadobutrol (GADAVIST) 1 MMOL/ML injection 6.5 mL (6.5 mLs Intravenous Contrast Given 05/29/20 0310)    ED Course  I have reviewed the triage vital signs and the nursing notes.  Pertinent labs & imaging results that were available during my care of the patient were reviewed by me and considered in my medical decision making (see chart for details).    MDM Rules/Calculators/A&P                           Patient presents with abdominal pain, subjective left arm and left leg numbness and paresthesias, resolved speech deficits and minimal weakness on exam.  Patient is alert and oriented.  Less likely CVA however will obtain CT head to assess for possible SAH secondary to straining for bowel movement.   Considering SAH, aortic dissection, constipation, appendicitis.  Additional history obtained:  Additional history obtained from fianc. Previous records obtained and reviewed including echocardiogram in October 2021.   Lab Tests:  I Ordered, reviewed, and interpreted labs, which included:  Urinalysis with significantly increase specific gravity.  Fluids given. CBC with leukocytosis but otherwise unremarkable. Other labs all within normal limits.  Covid negative.    Imaging Studies ordered:  I have placed order for imaging including CT head, CT angio chest abdomen pelvis, MRI cervical and brain. I personally reviewed and interpreted imaging.   CT head without acute intracranial hemorrhage.  CT chest abdomen  pelvis without evidence of dissection, inflammatory infectious disease, bowel perforation, pulmonary embolism.  RI without evidence of hemorrhage, ischemia, demyelination, mass or other abnormality.   ED Course:   1:36 AM T scan of the abdomen without acute abnormality.  No evidence of aortic dissection, appendicitis, inflammatory or infectious etiology in the abdomen.  Patient continues to decline pain medication.  She does have significant stool burden on CT scan and I suspect abdominal pain and straining today were secondary to severe constipation.  Will treat outpatient.  Patient continues to have subjective numbness in the left arm and leg stating that they are weak.  Repeat neuro exam with 5/5 strength in the upper and lower extremities.  Continues to voice decreased sensation.  CT scan of the head without intracranial hemorrhage.  Concern for demyelinating disease or possible small area of ischemia.  Will obtain MRI of head and neck.  3:59 AM Patient reports she is feeling much better at this time.  She is ready to go home.  Work-up with likely etiology of abdominal pain  secondary to constipation.  No findings for patient's paresthesias.  She will need close primary care follow-up.  The patient was discussed with and seen by Dr. Pilar Plate who agrees with the treatment plan.   Patient voiced understanding and agreement wit the current medical evaluation and treatment plan.  Questions were answered to expressed satisfaction.    Strict return precautions given and patient is stable at time of discharge.    Portions of this note were generated with Scientist, clinical (histocompatibility and immunogenetics). Dictation errors may occur despite best attempts at proofreading.   Final Clinical Impression(s) / ED Diagnoses Final diagnoses:  Generalized abdominal pain  Constipation, unspecified constipation type  Paresthesias    Rx / DC Orders ED Discharge Orders         Ordered    polyethylene glycol (GOLYTELY) 236 g  solution   Once        05/29/20 0349    polyethylene glycol (MIRALAX / GLYCOLAX) 17 g packet  Daily        05/29/20 0349           Mardene Lessig, Dahlia Client, PA-C 05/29/20 0400    Sabas Sous, MD 05/29/20 [phone]

## 2020-05-29 ENCOUNTER — Emergency Department (HOSPITAL_COMMUNITY): Payer: 59

## 2020-05-29 LAB — URINALYSIS, ROUTINE W REFLEX MICROSCOPIC
Bacteria, UA: NONE SEEN
Bilirubin Urine: NEGATIVE
Glucose, UA: NEGATIVE mg/dL
Hgb urine dipstick: NEGATIVE
Ketones, ur: NEGATIVE mg/dL
Nitrite: NEGATIVE
Protein, ur: NEGATIVE mg/dL
Specific Gravity, Urine: >1.046 — ABNORMAL HIGH (ref 1.005–1.030)
pH: 7 (ref 5.0–8.0)

## 2020-05-29 LAB — SARS CORONAVIRUS 2 (TAT 6-24 HRS): SARS Coronavirus 2: NEGATIVE

## 2020-05-29 IMAGING — MR MR HEAD WO/W CM
14 of 16 series · 40 of 48 positions shown · IV contrast (gadavist)
Comparison: Previous brain MRI from [DATE].

CLINICAL DATA: Initial evaluation for left-sided numbness,
paresthesias.

EXAM:
MRI HEAD WITHOUT AND WITH CONTRAST
MRI CERVICAL SPINE WITHOUT AND WITH CONTRAST
TECHNIQUE: Multiplanar, multiecho pulse sequences of the brain and surrounding
structures, and cervical spine, to include the craniocervical
junction and cervicothoracic junction, were obtained without and
with intravenous contrast.
CONTRAST:  6.5mL GADAVIST GADOBUTROL 1 MMOL/ML IV SOLN

[Series 5: DWI · axial · 3.0mm · 0.81mm/px · z∈[-149,-20]mm · 5 of 100 slices shown (1 of 4)]
[im 1/100]
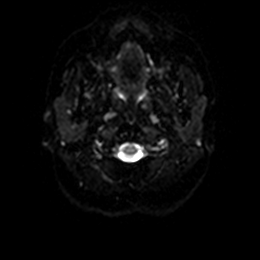
[im 25/100]
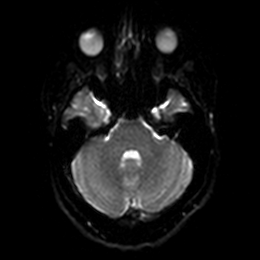
[im 50/100]
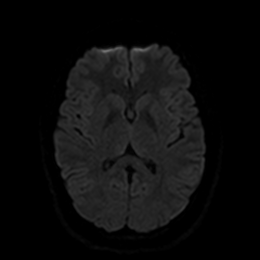
[im 75/100]
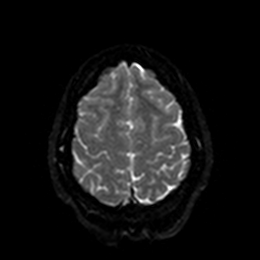
[im 100/100]
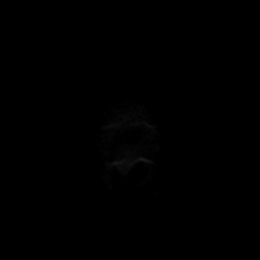

[Series 6: DWI · axial · 3.0mm · 0.81mm/px · z∈[-149,-20]mm · 2 of 50 slices shown (2 of 4)]
[im 1/50]
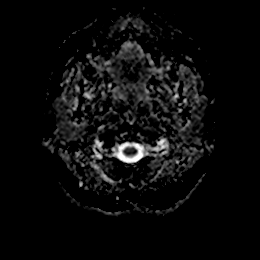
[im 50/50]
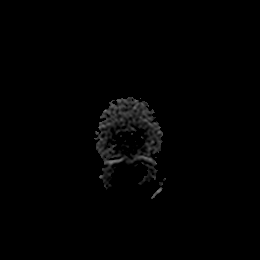

[Series 7: DWI · coronal · 4.0mm · 0.88mm/px · 4 of 68 slices shown (3 of 4)]
[im 1/68]
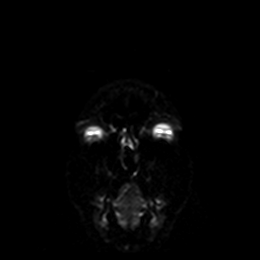
[im 23/68]
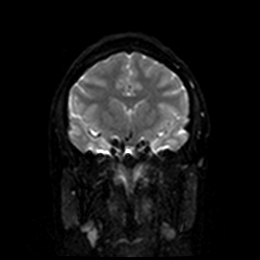
[im 45/68]
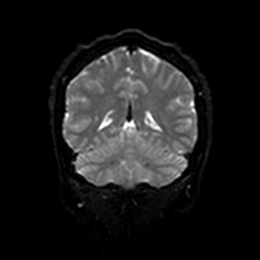
[im 68/68]
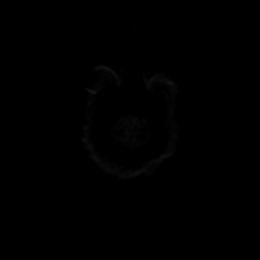

[Series 8: DWI · coronal · 4.0mm · 0.88mm/px · 2 of 34 slices shown (4 of 4)]
[im 1/34]
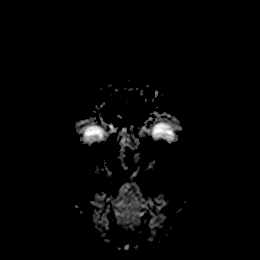
[im 34/34]
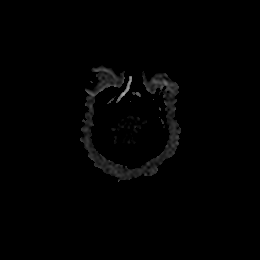

[Series 9: T1 · sagittal · 5.0mm · 0.75mm/px · 2 of 25 slices shown]
[im 1/25]
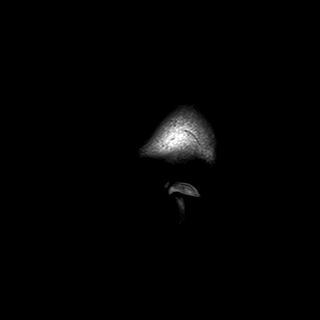
[im 25/25]
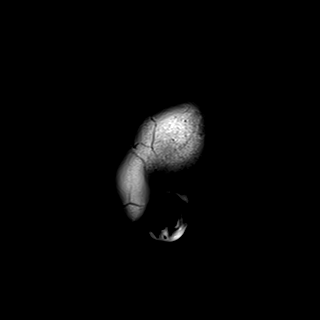

[Series 10: T2 · axial · 5.0mm · 0.69mm/px · z∈[-160,-23]mm · 2 of 27 slices shown (1 of 2)]
[im 1/27]
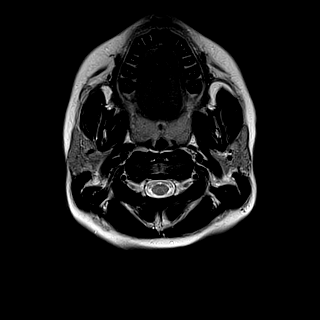
[im 27/27]
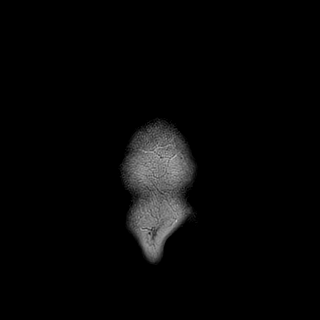

[Series 11: FLAIR · axial · 5.0mm · 0.86mm/px · z∈[-160,-23]mm · 2 of 27 slices shown]
[im 1/27]
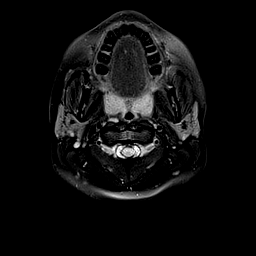
[im 27/27]
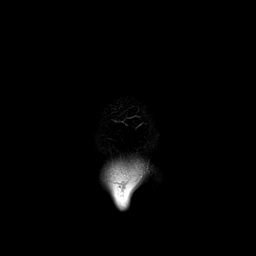

[Series 12: mag_images · axial · 3.0mm · 0.86mm/px · z∈[-165,-19]mm · 4 of 56 slices shown]
[im 1/56]
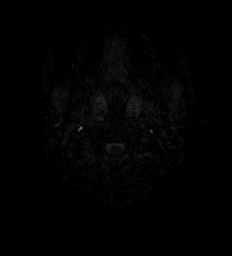
[im 19/56]
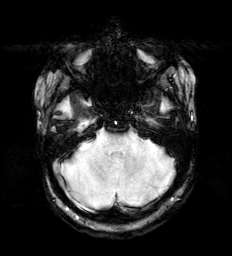
[im 37/56]
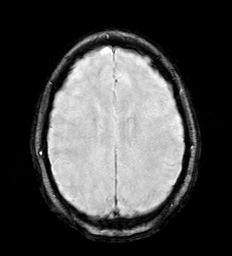
[im 56/56]
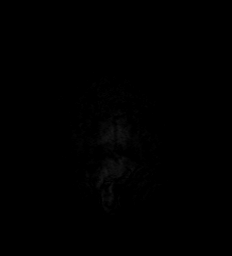

[Series 13: pha_images · axial · 3.0mm · 0.86mm/px · z∈[-165,-19]mm · 4 of 56 slices shown]
[im 1/56]
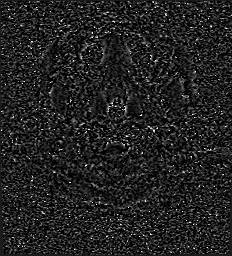
[im 19/56]
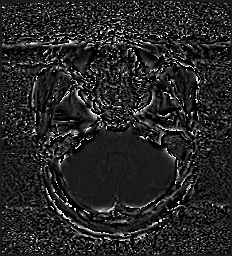
[im 37/56]
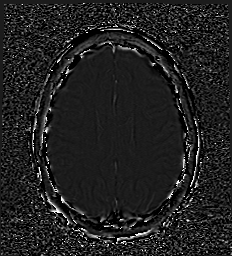
[im 56/56]
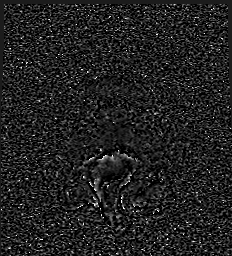

[Series 14: swi_images · axial · 3.0mm · 0.86mm/px · z∈[-165,-19]mm · 4 of 56 slices shown]
[im 1/56]
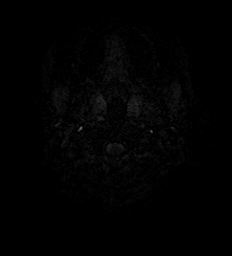
[im 19/56]
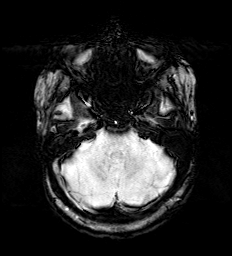
[im 37/56]
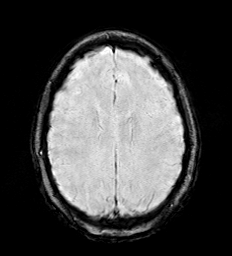
[im 56/56]
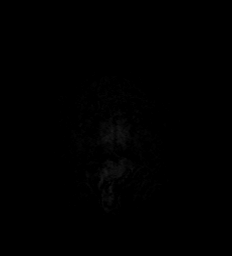

[Series 15: mip_images(sw) · axial · 24.0mm · 0.86mm/px · z∈[-156,-28]mm · 3 of 49 slices shown]
[im 1/49]
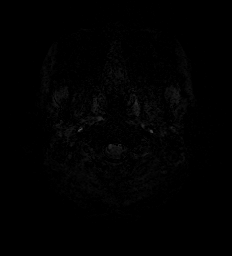
[im 25/49]
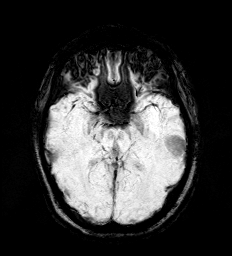
[im 49/49]
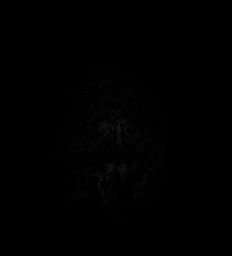

[Series 21: T2 · coronal · 5.0mm · 0.72mm/px · 2 of 32 slices shown (2 of 2)]
[im 1/32]
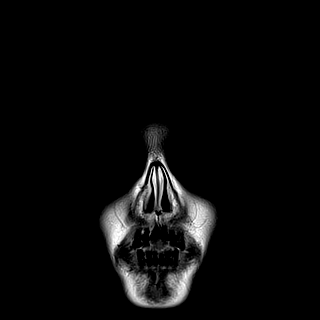
[im 32/32]
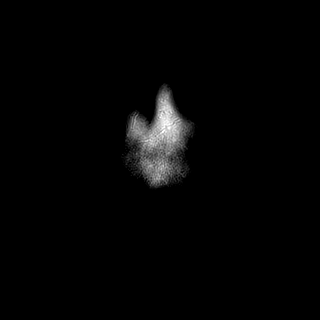

[Series 23: T1 post-contrast · coronal · 5.0mm · 0.34mm/px · 2 of 32 slices shown (1 of 2)]
[im 1/32]
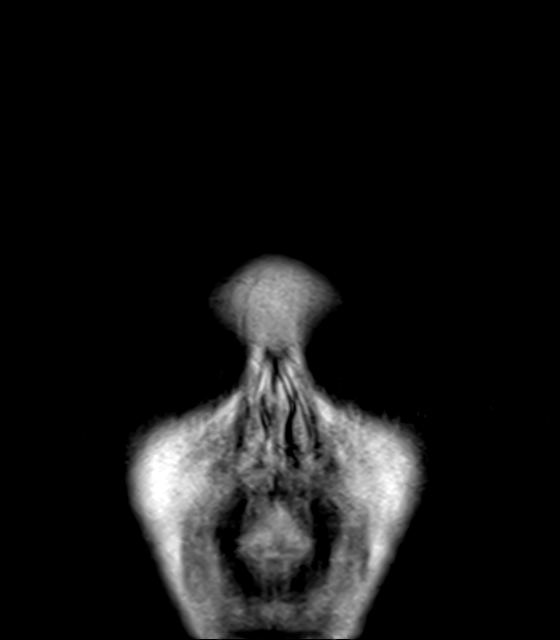
[im 32/32]
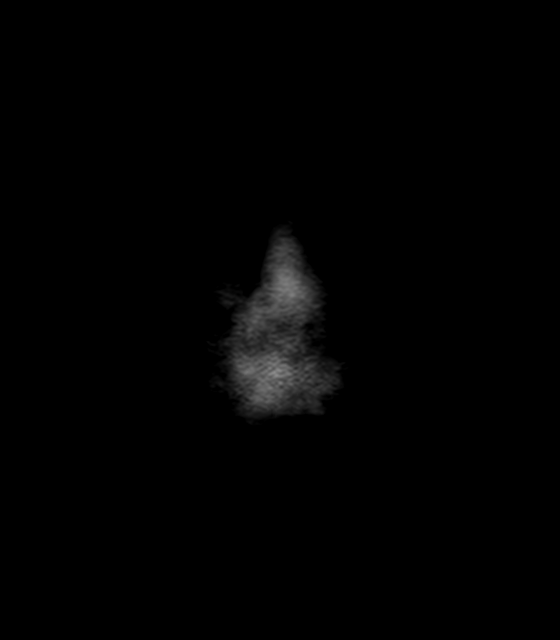

[Series 24: T1 post-contrast · sagittal · 5.0mm · 0.75mm/px · 2 of 25 slices shown (2 of 2)]
[im 1/25]
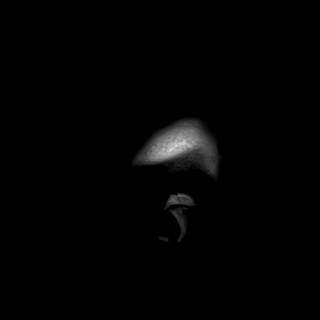
[im 25/25]
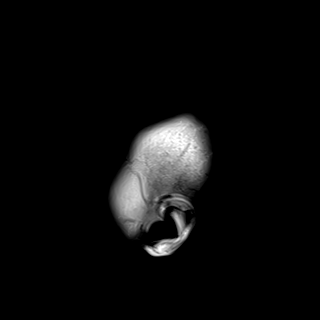

[40 of 48 positions shown; findings below may reference images not displayed]

FINDINGS: MRI HEAD FINDINGS

Brain: Examination mildly degraded by motion.

Cerebral volume within normal limits for patient age. No focal
parenchymal signal abnormality identified.

No abnormal foci of restricted diffusion to suggest acute or
subacute ischemia. Gray-white matter differentiation well
maintained. No encephalomalacia to suggest chronic infarction. No
foci of susceptibility artifact to suggest acute or chronic
intracranial hemorrhage.

No mass lesion, midline shift or mass effect. No hydrocephalus. No
extra-axial fluid collection. Major dural sinuses are grossly
patent.

Pituitary gland and suprasellar region are normal. Midline
structures intact and normal.

No abnormal enhancement.

Vascular: Major intracranial vascular flow voids well maintained and
normal in appearance.

Skull and upper cervical spine: Craniocervical junction normal.
Visualized upper cervical spine within normal limits. Bone marrow
signal intensity normal. No scalp soft tissue abnormality.

Sinuses/Orbits: Globes and orbital soft tissues within normal
limits.

Paranasal sinuses are clear. No mastoid effusion. Inner ear
structures normal.

Other: None.

MRI CERVICAL SPINE FINDINGS

Alignment: Examination degraded by motion artifact.

Straightening with mild smooth reversal of the normal cervical
lordosis. No listhesis.

Vertebrae: Vertebral body height maintained without acute or chronic
fracture. Bone marrow signal intensity within normal limits for age.
No discrete or worrisome osseous lesions. No abnormal marrow edema
or enhancement.

Cord: Signal intensity within the cervical spinal cord is grossly
within normal limits. No convincing cord signal abnormality on this
motion degraded exam. No abnormal enhancement.

Posterior Fossa, vertebral arteries, paraspinal tissues: Visualized
brain and posterior fossa within normal limits. Craniocervical
junction normal. Paraspinous and prevertebral soft tissues within
normal limits. Normal intravascular flow voids seen within the
vertebral arteries bilaterally.

Disc levels:

C2-C3: Unremarkable.

C3-C4:  Unremarkable.

C4-C5:  Mild annular disc bulge.  No canal or foraminal stenosis.

C5-C6:  Mild annular disc bulge.  No canal or foraminal stenosis.

C6-C7:  Unremarkable.

C7-T1:  Unremarkable.
IMPRESSION: Normal MRIs of the brain and cervical spine. No findings to explain
patient's symptoms identified.

## 2020-05-29 IMAGING — MR MR CERVICAL SPINE WO/W CM
9 of 11 series · 28 of 48 positions shown · IV contrast (gadavist)
Comparison: Previous brain MRI from [DATE].

CLINICAL DATA: Initial evaluation for left-sided numbness,
paresthesias.

EXAM:
MRI HEAD WITHOUT AND WITH CONTRAST
MRI CERVICAL SPINE WITHOUT AND WITH CONTRAST
TECHNIQUE: Multiplanar, multiecho pulse sequences of the brain and surrounding
structures, and cervical spine, to include the craniocervical
junction and cervicothoracic junction, were obtained without and
with intravenous contrast.
CONTRAST:  6.5mL GADAVIST GADOBUTROL 1 MMOL/ML IV SOLN

[Series 9: T2 · sagittal · 3.0mm · 0.50mm/px · 1 of 15 slices shown (1 of 3)]
[im 1/15]
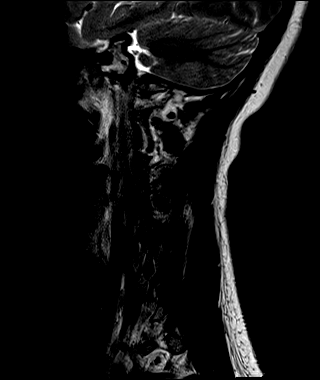

[Series 10: T1 · sagittal · 3.0mm · 0.56mm/px · 1 of 15 slices shown (1 of 2)]
[im 1/15]
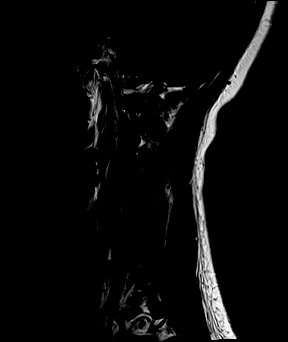

[Series 12: STIR · sagittal · 3.0mm · 0.71mm/px · 2 of 15 slices shown]
[im 1/15]
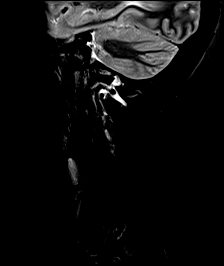
[im 15/15]
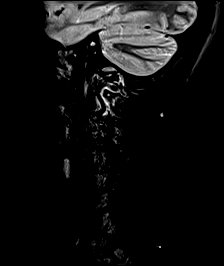

[Series 13: T2 · axial · 3.0mm · 0.66mm/px · z∈[-246,-126]mm · 4 of 40 slices shown (2 of 3)]
[im 1/40]
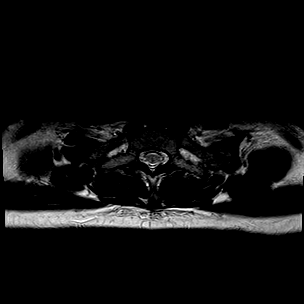
[im 14/40]
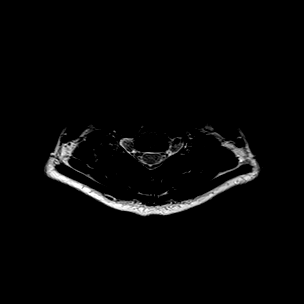
[im 27/40]
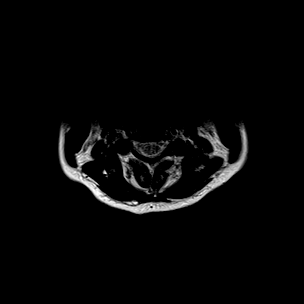
[im 40/40]
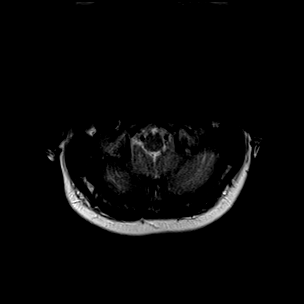

[Series 15: T1 · axial · 3.0mm · 0.39mm/px · z∈[-250,-133]mm · 4 of 40 slices shown (2 of 2)]
[im 1/40]
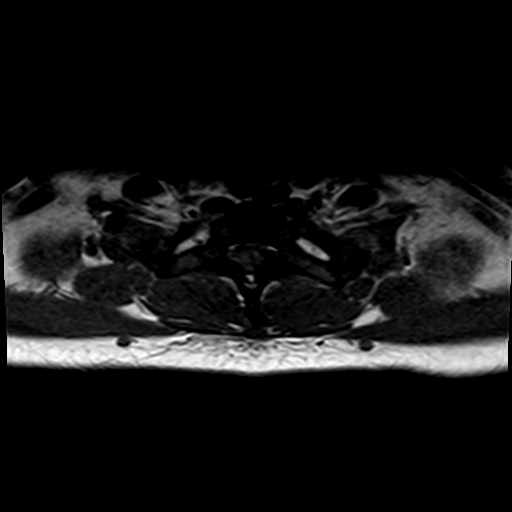
[im 14/40]
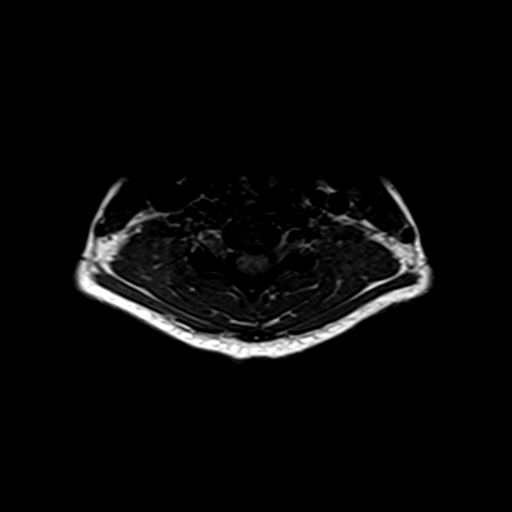
[im 27/40]
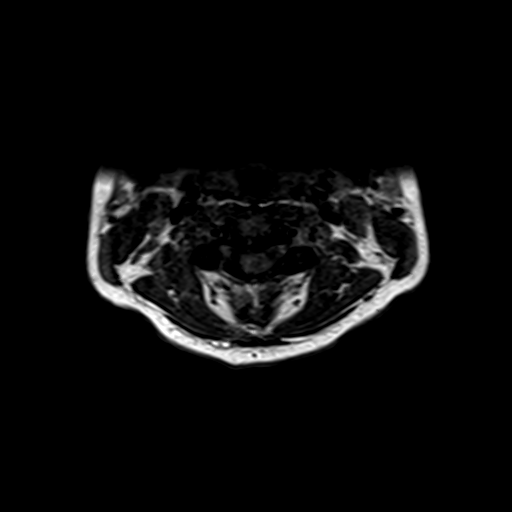
[im 40/40]
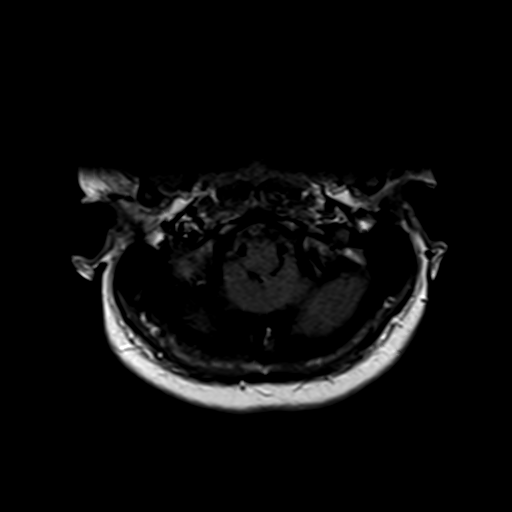

[Series 17: T1 fat-sat post-contrast · sagittal · 3.0mm · 0.36mm/px · 2 of 15 slices shown (1 of 2)]
[im 1/15]
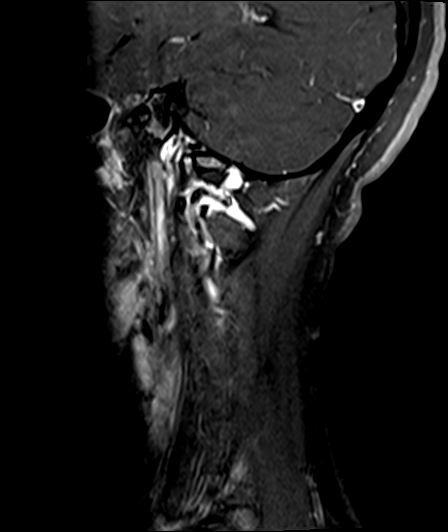
[im 15/15]
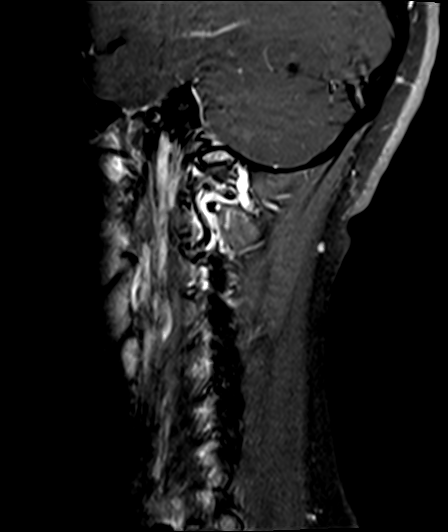

[Series 18: T1 post-contrast · axial · 3.0mm · 0.39mm/px · z∈[-250,-211]mm · 2 of 40 slices shown]
[im 1/40]
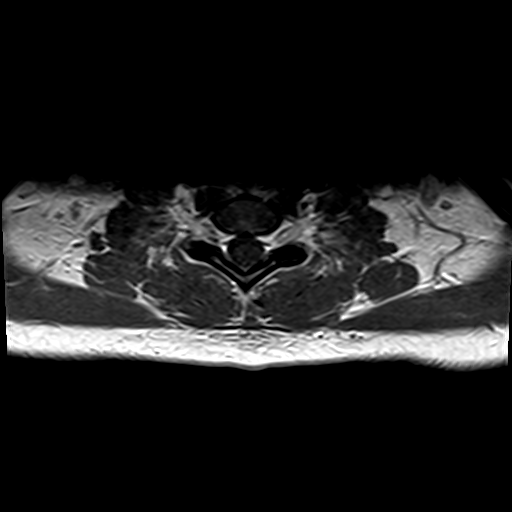
[im 14/40]
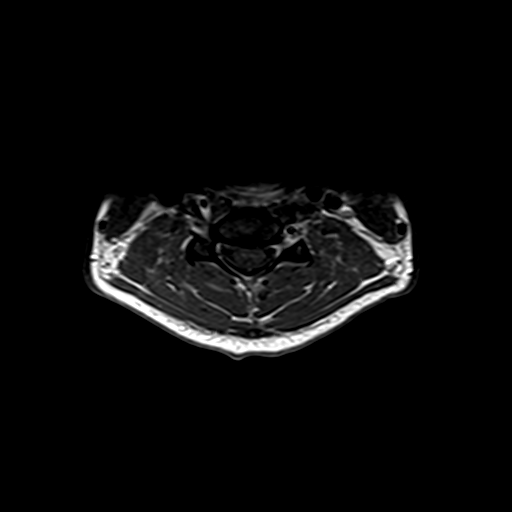

[Series 19: T1 fat-sat post-contrast · sagittal · 3.0mm · 0.42mm/px · 2 of 15 slices shown (2 of 2)]
[im 1/15]
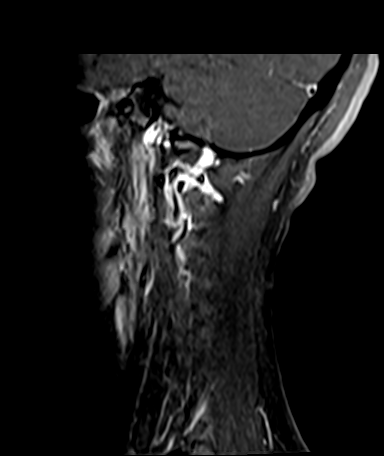
[im 15/15]
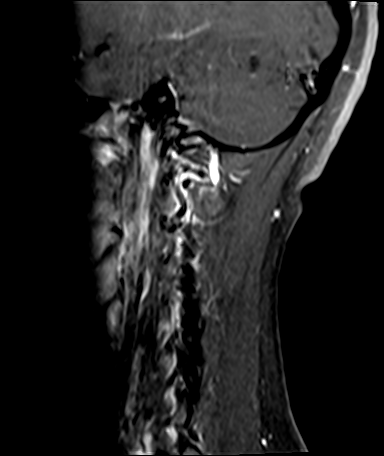

[Series 1041: T2 · axial · 1.0mm · 0.22mm/px · z∈[-236,-72]mm · 10 of 169 slices shown (3 of 3)]
[im 10/169]
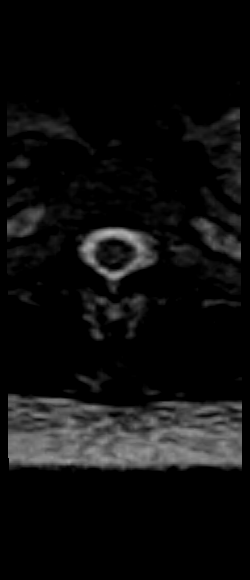
[im 30/169]
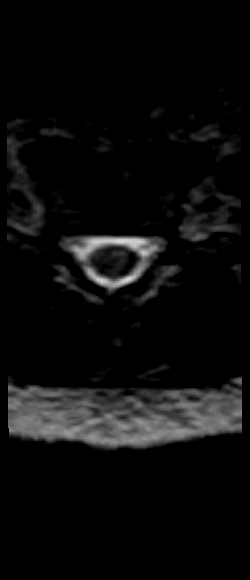
[im 50/169]
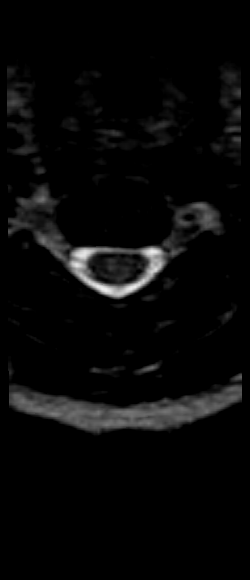
[im 70/169]
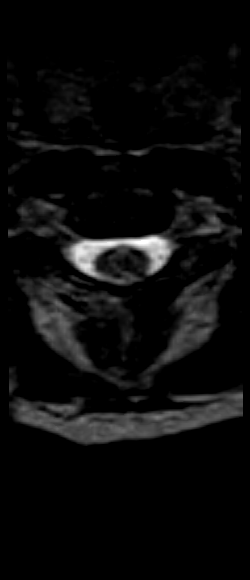
[im 89/169]
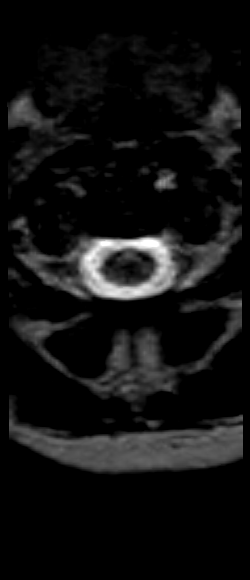
[im 99/169]
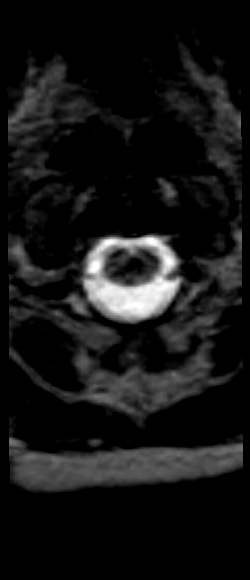
[im 119/169]
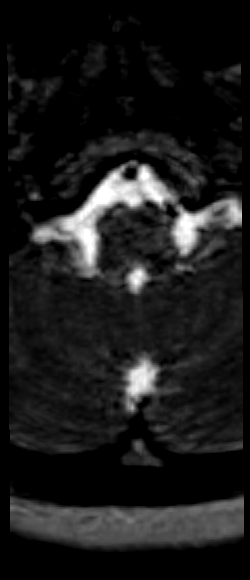
[im 139/169]
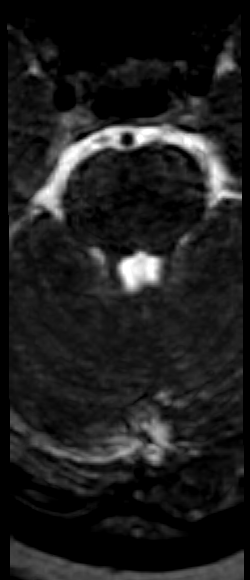
[im 149/169]
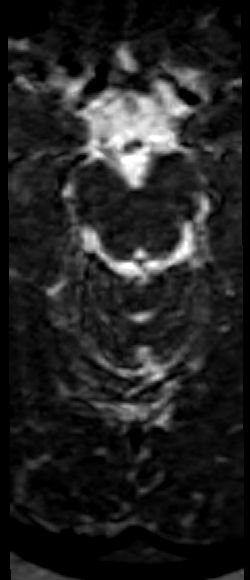
[im 159/169]
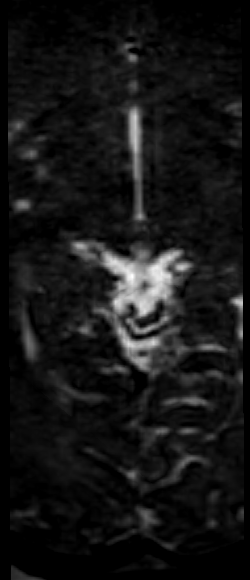

[28 of 48 positions shown; findings below may reference images not displayed]

FINDINGS: MRI HEAD FINDINGS

Brain: Examination mildly degraded by motion.

Cerebral volume within normal limits for patient age. No focal
parenchymal signal abnormality identified.

No abnormal foci of restricted diffusion to suggest acute or
subacute ischemia. Gray-white matter differentiation well
maintained. No encephalomalacia to suggest chronic infarction. No
foci of susceptibility artifact to suggest acute or chronic
intracranial hemorrhage.

No mass lesion, midline shift or mass effect. No hydrocephalus. No
extra-axial fluid collection. Major dural sinuses are grossly
patent.

Pituitary gland and suprasellar region are normal. Midline
structures intact and normal.

No abnormal enhancement.

Vascular: Major intracranial vascular flow voids well maintained and
normal in appearance.

Skull and upper cervical spine: Craniocervical junction normal.
Visualized upper cervical spine within normal limits. Bone marrow
signal intensity normal. No scalp soft tissue abnormality.

Sinuses/Orbits: Globes and orbital soft tissues within normal
limits.

Paranasal sinuses are clear. No mastoid effusion. Inner ear
structures normal.

Other: None.

MRI CERVICAL SPINE FINDINGS

Alignment: Examination degraded by motion artifact.

Straightening with mild smooth reversal of the normal cervical
lordosis. No listhesis.

Vertebrae: Vertebral body height maintained without acute or chronic
fracture. Bone marrow signal intensity within normal limits for age.
No discrete or worrisome osseous lesions. No abnormal marrow edema
or enhancement.

Cord: Signal intensity within the cervical spinal cord is grossly
within normal limits. No convincing cord signal abnormality on this
motion degraded exam. No abnormal enhancement.

Posterior Fossa, vertebral arteries, paraspinal tissues: Visualized
brain and posterior fossa within normal limits. Craniocervical
junction normal. Paraspinous and prevertebral soft tissues within
normal limits. Normal intravascular flow voids seen within the
vertebral arteries bilaterally.

Disc levels:

C2-C3: Unremarkable.

C3-C4:  Unremarkable.

C4-C5:  Mild annular disc bulge.  No canal or foraminal stenosis.

C5-C6:  Mild annular disc bulge.  No canal or foraminal stenosis.

C6-C7:  Unremarkable.

C7-T1:  Unremarkable.
IMPRESSION: Normal MRIs of the brain and cervical spine. No findings to explain
patient's symptoms identified.

## 2020-05-29 MED ORDER — GOLYTELY 236 G PO SOLR: 2.0000 L | Freq: Once | ORAL | 0 refills | Status: AC

## 2020-05-29 MED ORDER — SODIUM CHLORIDE 0.9 % IV BOLUS
1000.0000 mL | Freq: Once | INTRAVENOUS | Status: AC
  Administered 2020-05-29: 1000 mL via INTRAVENOUS

## 2020-05-29 MED ORDER — GADOBUTROL 1 MMOL/ML IV SOLN
6.5000 mL | Freq: Once | INTRAVENOUS | Status: AC | PRN
  Administered 2020-05-29: 6.5 mL via INTRAVENOUS

## 2020-05-29 MED ORDER — POLYETHYLENE GLYCOL 3350 17 G PO PACK: 17.0000 g | PACK | Freq: Every day | ORAL | 0 refills | Status: AC

## 2020-05-29 NOTE — Discharge Instructions (Addendum)
1. Medications: Golytely and miralax, usual home medications 2. Treatment: rest, drink plenty of fluids, advance diet slowly -GoLYTELY until you began to have bowel movements.  Afterwards take the MiraLAX 1 time per day.  Make sure you are eating plenty of fiber and drinking plenty of fluids. 3. Follow Up: Please followup with your primary doctor in 2 days for discussion of your diagnoses and further evaluation after today's visit; if you do not have a primary care doctor use the resource guide provided to find one; Please return to the ER for persistent vomiting, high fevers or worsening symptoms

## 2020-05-29 NOTE — ED Notes (Signed)
Patient verbalizes understanding of discharge instructions. Prescriptions reviewed. Opportunity for questioning and answers were provided. Armband removed by staff, pt discharged from ED ambulatory. ° °
